# Patient Record
Sex: Male | Born: 1950 | Race: White | Hispanic: No | Marital: Married | Smoking: Current some day smoker
Health system: Southern US, Community
[De-identification: ages and names within clinical notes are randomized; demographics above are authoritative.]

## PROBLEM LIST (undated history)

## (undated) DIAGNOSIS — A809 Acute poliomyelitis, unspecified: Secondary | ICD-10-CM

## (undated) DIAGNOSIS — R17 Unspecified jaundice: Secondary | ICD-10-CM

## (undated) DIAGNOSIS — Z8619 Personal history of other infectious and parasitic diseases: Secondary | ICD-10-CM

## (undated) DIAGNOSIS — I4891 Unspecified atrial fibrillation: Secondary | ICD-10-CM

## (undated) DIAGNOSIS — E119 Type 2 diabetes mellitus without complications: Secondary | ICD-10-CM

## (undated) DIAGNOSIS — I82409 Acute embolism and thrombosis of unspecified deep veins of unspecified lower extremity: Secondary | ICD-10-CM

## (undated) DIAGNOSIS — M1710 Unilateral primary osteoarthritis, unspecified knee: Secondary | ICD-10-CM

## (undated) HISTORY — PX: LEG SURGERY: SHX1003

## (undated) HISTORY — DX: Personal history of other infectious and parasitic diseases: Z86.19

## (undated) HISTORY — DX: Unilateral primary osteoarthritis, unspecified knee: M17.10

## (undated) HISTORY — DX: Unspecified jaundice: R17

## (undated) HISTORY — PX: KNEE ARTHROSCOPY: SUR90

## (undated) HISTORY — DX: Acute poliomyelitis, unspecified: A80.9

## (undated) HISTORY — PX: CATARACT EXTRACTION: SUR2

## (undated) HISTORY — PX: RETINAL DETACHMENT SURGERY: SHX105

## (undated) HISTORY — DX: Acute embolism and thrombosis of unspecified deep veins of unspecified lower extremity: I82.409

## (undated) HISTORY — PX: TONSILLECTOMY: SUR1361

---

## 1999-12-28 ENCOUNTER — Encounter: Payer: Self-pay | Admitting: Ophthalmology

## 1999-12-28 ENCOUNTER — Ambulatory Visit (HOSPITAL_COMMUNITY): Admission: RE | Admit: 1999-12-28 | Discharge: 1999-12-29 | Payer: Self-pay | Admitting: Ophthalmology

## 2009-05-16 LAB — HM COLONOSCOPY

## 2010-01-20 ENCOUNTER — Ambulatory Visit: Payer: Self-pay | Admitting: Family Medicine

## 2010-01-20 DIAGNOSIS — R209 Unspecified disturbances of skin sensation: Secondary | ICD-10-CM

## 2010-01-29 ENCOUNTER — Encounter: Payer: Self-pay | Admitting: Family Medicine

## 2010-03-01 ENCOUNTER — Ambulatory Visit (HOSPITAL_BASED_OUTPATIENT_CLINIC_OR_DEPARTMENT_OTHER): Admission: RE | Admit: 2010-03-01 | Discharge: 2010-03-01 | Payer: Self-pay | Admitting: Family Medicine

## 2010-03-01 ENCOUNTER — Ambulatory Visit: Payer: Self-pay | Admitting: Diagnostic Radiology

## 2010-03-01 ENCOUNTER — Ambulatory Visit: Payer: Self-pay | Admitting: Family Medicine

## 2010-03-01 DIAGNOSIS — M20019 Mallet finger of unspecified finger(s): Secondary | ICD-10-CM

## 2010-03-17 ENCOUNTER — Ambulatory Visit: Payer: Self-pay | Admitting: Family Medicine

## 2010-03-17 DIAGNOSIS — M25579 Pain in unspecified ankle and joints of unspecified foot: Secondary | ICD-10-CM

## 2010-04-12 ENCOUNTER — Telehealth: Payer: Self-pay | Admitting: Family Medicine

## 2010-04-12 ENCOUNTER — Ambulatory Visit: Payer: Self-pay | Admitting: Family Medicine

## 2010-04-13 ENCOUNTER — Encounter: Payer: Self-pay | Admitting: Family Medicine

## 2010-06-15 NOTE — Assessment & Plan Note (Signed)
Summary: new to est/kn   Vital Signs:  Patient profile:   60 year old male Height:      66.50 inches Weight:      182 pounds BMI:     29.04 Pulse rate:   62 / minute BP sitting:   122 / 80  (left arm)  Vitals Entered By: Doristine Devoid CMA (January 20, 2010 10:02 AM) CC: NEW EST- R foot numbness and tingling along w/ intermittent pain   History of Present Illness: 60 yo man here today to establish care.  previous PCP- Robyn Zanard.  due for CPE.  had colonoscopy last year.  1) R foot numbness- sxs started 'several months ago'.  initially toes were tingling.  this has worsened and the numbness has progressed down his foot to the ball of the foot.  denies pain, more of a 'pins and needles' feeling.  when wearing shoes pt feels socks are 'bunched up around my toes'.  all toes are affected but lateral more than medial.  no back pain.  will also have some tingling in the calf.  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        Drug Use:  never.    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  Past Surgical History: Detached Retina-L eye Arthroscopic Knee- x2 R knee Several surgeries on L leg 2' polio Tonsillectomy Cataract extraction- L eye  Family History: CAD-no HTN-mother DM-brother STROKE-no COLON CA-no PROSTATE CA-no  Social History: married, daughter [place] works as a Information systems manager, previously a Tour manager Smoking Status:  never Sexual History:  currently monogamous Drug Use:  never  Review of Systems      See HPI  Physical Exam  General:   Well-developed,well-nourished,in no acute distress; alert,appropriate and cooperative throughout examination Head:  NCAT Msk:  atrophied L leg Pulses:  +2 DP/PT Extremities:  no C/C/E Neurologic:  strength normal on R- normal dorsi/plantar flexion, inversion/eversion of R ankle sensation intact to light touch gait normal DTRs symmetrical and normal.     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  needs nerve conduction study to determine where nerve is being compressed- proximal tib/fib jxn, tibialis anterior, tarsal tunnel.  start neurontin.  reviewed supportive care and red flags that should prompt return.  Pt expresses understanding and is in agreement w/ this plan. Orders: Orthopedic Referral (Ortho)  Complete Medication List: 1)  Neurontin 300 Mg Caps (Gabapentin) .Marland Kitchen.. 1 tab by mouth at bedtime x1 week and then increase to two times a day x1 week and then three times a day.  Patient Instructions: 1)  Please schedule your complete physical at your convenience- do not eat before this appt 2)  Start the neurontin as directed- this may cause drowsiness 3)  Someone will call you with your nerve conduction study 4)  Call with any questions or concerns 5)  Welcome!  We're glad to have you! Prescriptions: NEURONTIN 300  MG CAPS (GABAPENTIN) 1 tab by mouth at bedtime x1 week and then increase to two times a day x1 week and then three times a day.  #90 x 3   Entered and Authorized by:   Neena Rhymes MD   Signed by:   Neena Rhymes MD on 01/20/2010   Method used:   Electronically to        [facility]  [address] [phone]* (retail)       [address]       [place], [place]  [address]       Ph: [phone] or [phone]       Fax: [phone]   RxID:   [id number]

## 2010-06-15 NOTE — Assessment & Plan Note (Signed)
Summary: leg is swollen at the bottom/kn   Vital Signs:  Patient profile:   60 year old male Weight:      181 pounds Temp:     98.3 degrees F oral BP sitting:   128 / 76  (left arm)  Vitals Entered By: Doristine Devoid CMA (March 17, 2010 1:05 PM) CC: R ankle swollen and painful x1 wk    History of Present Illness: 60 yo man here today for R ankle pain and swelling.  pain initially started on R inner thigh at the end of Sept.  pain moved down to medial knee after riding bike to beach.  this resolved on its own.  pain then migrated to calf.  again resolved.  now pain is in the ankle.  sxs started here 7-10 days ago.  having difficulty walking.  icing regularly.  has used sporadic ibuprofen.  Current Medications (verified): 1)  Neurontin 300 Mg Caps (Gabapentin) .Marland Kitchen.. 1 Tab By Mouth At Bedtime X1 Week and Then Increase To Two Times A Day X1 Week and Then Three Times A Day.  Allergies (verified): No Known Drug Allergies  Review of Systems      See HPI  Physical Exam  General:  Well-developed,well-nourished,in no acute distress; alert,appropriate and cooperative throughout examination Msk:  + swelling and TTP over tibialis anterior tendon.  no pain w/ dorsi or plantar flexion, no pain w/ inversion, pain w/ resisted eversion.  no bony tenderness Pulses:  +2 DP/PT   Impression & Recommendations:  Problem # 1:  ANKLE PAIN (ICD-719.47) Assessment New pt w/ apparent tendonitis/bursitis of R ankle.  start NSAIDs.  given migratory nature of pain and fact that pt is overly R leg dominant due to residual effects of polio on the L suspect that pt has been compensating for areas that hurt- causing the pain to move.  given this issue w/ the kinetic chain and his reliance on his R leg will refer to sports med for evaluation and possible tx.  pt in agreement. Orders: Sports Medicine (Sports Med)  Complete Medication List: 1)  Neurontin 300 Mg Caps (Gabapentin) .Marland Kitchen.. 1 tab by mouth at bedtime x1  week and then increase to two times a day x1 week and then three times a day. 2)  Naproxen 500 Mg Tabs (Naproxen) .Marland Kitchen.. 1 tab by mouth two times a day x7-10 days and then as needed for pain.  take w/ food.  Patient Instructions: 1)  This appears to be tendonitis/bursitis 2)  The Naproxen should help w/ the pain and inflammation 3)  Continue to ice your ankle and elevate it as much as possible 4)  We will call you with the appt for Sports Medicine 5)  Hang in there!! Prescriptions: NAPROXEN 500 MG TABS (NAPROXEN) 1 tab by mouth two times a day x7-10 days and then as needed for pain.  take w/ food.  #60 x 1   Entered and Authorized by:   Neena Rhymes MD   Signed by:   Neena Rhymes MD on 03/17/2010   Method used:   [facility] to        [facility]  [address] [phone]* (retail)       [address]       [place], [place]  [address]       Ph: [phone] or [phone]       Fax: [phone]   RxID:   [id number]    Orders  Added: 1)  Sports Medicine [Sports Med] 2)  Est. Patient Level III [[address]109]

## 2010-06-15 NOTE — Assessment & Plan Note (Signed)
Summary: L pinky jammed /KN   Vital Signs:  Patient profile:   60 year old male Weight:      177 pounds Pulse rate:   110 / minute BP sitting:   130 / 80  (left arm)  Vitals Entered By: Doristine Devoid CMA (March 01, 2010 2:01 PM) CC: L pinky bent after jamming it can't straighten out unless it's laying flat   History of Present Illness: 60 yo man here today for jammed L pinky.  occurred 2 weeks, hasn't been able to straighten it.  'it doesn't hurt, it's just a little uncomfortable'.  no swelling, some bruising.  jammed finger while playing w/ the dog.  Current Medications (verified): 1)  Neurontin 300 Mg Caps (Gabapentin) .Marland Kitchen.. 1 Tab By Mouth At Bedtime X1 Week and Then Increase To Two Times A Day X1 Week and Then Three Times A Day.  Allergies (verified): No Known Drug Allergies  Review of Systems      See HPI  Physical Exam  General:  Well-developed,well-nourished,in no acute distress; alert,appropriate and cooperative throughout examination Msk:  L 5th finger w/ flexion at DIP joint w/ inability to actively extend Pulses:  +2 radial, ulnar   Impression & Recommendations:  Problem # 1:  MALLET FINGER (ICD-736.1) Assessment New pt w/ textbook mallet finger.  check xray to ensure no fracture or dislocation.  stressed importance of immobilization in complete extension for 6-8 weeks to allow healing.  pt and wife expressed understanding.  finger splinted in office. Orders: T-Finger(s) (73140TC)  Complete Medication List: 1)  Neurontin 300 Mg Caps (Gabapentin) .Marland Kitchen.. 1 tab by mouth at bedtime x1 week and then increase to two times a day x1 week and then three times a day.  Patient Instructions: 1)  This is a Mallet Finger 2)  Schedule a follow up with me in 6 weeks- sooner if needed 3)  You must keep the finger immobilized in full extension 4)  If there is a break, I'll refer you to ortho 5)  Tylenol, ibuprofen, ice as needed for pain relief 6)  Hang in there!   Orders  Added: 1)  T-Finger(s) [73140TC] 2)  Est. Patient Level III [44010]

## 2010-06-15 NOTE — Progress Notes (Signed)
Summary: ANKLE PAIN REFERRAL  Phone Note Other Incoming   Summary of Call: IN REFERENCE TO ANKLE PAIN REFERRAL TO Dr Pearletha Forge, PATIENT NOSHOWED FOR HIS APPOINTMENT ON 03-19-2010, I LEFT MESSAGE, & SPOKE WITH PATIENT, SO HE WAS AWARE OF THE APPT, HE DID NOT RSC'D. Initial call taken by: Magdalen Spatz [facility],  April 12, 2010 4:45 PM  Follow-up for Phone Call        noted Follow-up by: Neena Rhymes MD,  April 12, 2010 4:47 PM

## 2010-06-15 NOTE — Assessment & Plan Note (Signed)
Summary: RECHECK LEFT PINKY FINGER///SPH   Vital Signs:  Patient profile:   60 year old male Height:      66.50 inches Weight:      181 pounds BMI:     28.88 Temp:     98.2 degrees F oral Pulse rate:   76 / minute BP sitting:   122 / 82  (left arm)  Vitals Entered By: Jeremy Johann CMA (April 12, 2010 3:56 PM) CC: recheck finger   History of Present Illness: 60 yo man here today for f/u on his mallet finger.  has been immobilized in 'full extension' for 6 weeks.  pt removed splint every 3-4 days to wash hand and change the tape.  took splint off this AM.  finger is irritated due to tape and pt is unhappy w/ the result.  reports DIP joint is now bent and 'crooked', painful to straighten.  Current Medications (verified): 1)  Neurontin 300 Mg Caps (Gabapentin) .Marland Kitchen.. 1 Tab By Mouth At Bedtime X1 Week and Then Increase To Two Times A Day X1 Week and Then Three Times A Day. 2)  Naproxen 500 Mg Tabs (Naproxen) .Marland Kitchen.. 1 Tab By Mouth Two Times A Day X7-10 Days and Then As Needed For Pain.  Take W/ Food.  Allergies (verified): No Known Drug Allergies  Review of Systems      See HPI  Physical Exam  General:  Well-developed,well-nourished,in no acute distress; alert,appropriate and cooperative throughout examination Msk:  L little finger remains bent at DIP joint w/ inability to extend.  DIP joint is also now angled toward the 4th finger. Pulses:  +2 radial, ulnar.   Impression & Recommendations:  Problem # 1:  MALLET FINGER (ICD-736.1) Assessment Unchanged pt kept finger splinted but apparently not in complete extension.  it appears that as he removed the splint to wash and change tape he did not extend/hyperextend his finger before resplinting.  he never returned to the office to check position.  given that finger remains flexed and is now angulated, will refer to hand specialist. Orders: Orthopedic Referral (Ortho)  Complete Medication List: 1)  Neurontin 300 Mg Caps  (Gabapentin) .Marland Kitchen.. 1 tab by mouth at bedtime x1 week and then increase to two times a day x1 week and then three times a day. 2)  Naproxen 500 Mg Tabs (Naproxen) .Marland Kitchen.. 1 tab by mouth two times a day x7-10 days and then as needed for pain.  take w/ food.  Patient Instructions: 1)  Keep finger splinted as straight as possible until your appt w/ the hand specialist 2)  Use paper tape 3)  Ibuprofen as needed for pain 4)  Hang in there!   Orders Added: 1)  Orthopedic Referral [Ortho] 2)  Est. Patient Level III [16109]

## 2010-06-17 NOTE — Consult Note (Signed)
Summary: [facility] [place]   Imported By: Lanelle Bal 04/27/2010 08:55:30  _____________________________________________________________________  External Attachment:    Type:   Image     Comment:   External Document

## 2010-10-01 NOTE — Op Note (Signed)
[place]  Patient:    Connor Griffith, Connor Griffith                     MRN: [id number] Proc. Date: 12/28/99 Adm. Date:  [id number] Attending:  Bertrum Sol                           Operative Report  DATE OF BIRTH: 05/04/1951  ADMISSION DIAGNOSIS: Retinal breaks right eye, retinal detachment left eye.  PROCEDURES: Retinal photocoagulation around breaks right eye and scleral buckle left eye, retinal photocoagulation left eye.  SURGEON: Alan Mulder, M.D.  ASSISTANT: Winfred Burn, COA,SA  ANESTHESIA: General.  DETAILS: After proper endotracheal anesthesia attention was carried to the right eye where multiple breaks were seen superiorly, inferiorly and temporally. These were surrounded with the indirect ophthalmoscope laser, 483 burns were placed around breaks with the power between 360 and 400 milliwatts, 1000 microns each and duration between 0.05 and 0.10 seconds.  The eye was then treated with ointment and closed.  Attention was carried to the left eye where the usual prep and drape was performed.  A 360 degree limbal peritomy isolation of four rectus muscles on 2-0 Silk. Localization of break at 2:00 with one cryo-application.  Scleral dissection from 12:00 to 6:00 to admit a #279 intrascleral implants and 1 mm was trimmed from the posterior edge at 5:00 where a vortex vein was encountered. Diathermy was placed in the bed. Two scleral sutures were placed in the upper quadrant and two in the lower quadrants. Knots were placed posteriorly where they would have been visualized. A perforation site at 4:00 revealed moderate amount of thick colorless subretinal fluids. A perforation at 3:00 in the posterior aspect of the bed resulted in a large amount of clear colorless subretinal fluid. A 50AG was fashioned in a segmental fashion and placed at 2:00. A #279 implant was trimmed and placed in the bed from 12 to 6:00. A 240 band was placed  around the eye with a belt loop at 8:00 and 10:00. A 270 sleeve was placed at 11:00. The band was adjusted to a proper indentation of the globe. The ends were trimmed. The indirect ophthalmoscopy showed the retina to be lying nicely on the scleral buckle. The indirect ophthalmoscope placed and was moved into place and 638 burns were placed on the buckle with a power between 360 and 400 milliwatts, 1000 microns each and 0.05 to 0.10 duration. The buckle ends were trimmed and the suture ends were trimmed. The conjunctiva was reposited of 7-0 chromic suture. Polymyxin and gentamicin were irrigated in the tenon space. Atropine solution was applied. Decadron 10 mg was injected into the lower subconjunctival space. Marcaine was injected around the globe for postop pain. The closing tension was 10 with a Barraquer tonometer.  COMPLICATIONS: None.  DURATION: 2 hours.  Polysporin, a patch and shield were placed on the left eye. The patient was awakened and taken to recovery in satisfactory condition. DD:  12/28/99 TD:  12/28/99 Job: [id number]

## 2010-12-03 ENCOUNTER — Encounter: Payer: Self-pay | Admitting: Family Medicine

## 2011-04-13 ENCOUNTER — Encounter: Payer: Self-pay | Admitting: Family Medicine

## 2011-04-13 ENCOUNTER — Ambulatory Visit (INDEPENDENT_AMBULATORY_CARE_PROVIDER_SITE_OTHER): Payer: BC Managed Care – PPO | Admitting: Family Medicine

## 2011-04-13 DIAGNOSIS — Z0000 Encounter for general adult medical examination without abnormal findings: Secondary | ICD-10-CM | POA: Insufficient documentation

## 2011-04-13 DIAGNOSIS — M25579 Pain in unspecified ankle and joints of unspecified foot: Secondary | ICD-10-CM

## 2011-04-13 NOTE — Progress Notes (Signed)
  Subjective:    Patient ID: Connor Griffith, male    DOB: 1950-08-19, 60 y.o.   MRN: [id number]  HPI CPE- UTD on colonoscopy, [place] (2010).  R foot numbness- sxs are localized to toes, had nerve conduction study done last year.  Now having ankle swelling and pain- TTP over R medial malleolus.   Review of Systems Patient reports no vision/hearing changes, anorexia, fever ,adenopathy, persistant/recurrent hoarseness, swallowing issues, chest pain, palpitations, edema, persistant/recurrent cough, hemoptysis, dyspnea (rest,exertional, paroxysmal nocturnal), gastrointestinal  bleeding (melena, rectal bleeding), abdominal pain, excessive heart burn, GU symptoms (dysuria, hematuria, voiding/incontinence issues) syncope, focal weakness, memory loss, skin/hair/nail changes, depression, anxiety, abnormal bruising/bleeding.    Objective:   Physical Exam BP 120/80  Pulse 78  Temp(Src) 98.8 F (37.1 C) (Oral)  Ht 5\' 6"  (1.676 m)  Wt 181 lb 6.4 oz (82.283 kg)  BMI 29.28 kg/m2  SpO2 97%  General Appearance:    Alert, cooperative, no distress, appears stated age  Head:    Normocephalic, without obvious abnormality, atraumatic  Eyes:    PERRL, conjunctiva/corneas clear, EOM's intact      Ears:    Normal TM's and external ear canals, both ears  Nose:   Nares normal, septum midline, mucosa normal, no drainage   or sinus tenderness  Throat:   Lips, mucosa, and tongue normal; teeth and gums normal  Neck:   Supple, symmetrical, trachea midline, no adenopathy;       thyroid:  No enlargement/tenderness/nodules  Back:     Symmetric, no curvature, ROM normal, no CVA tenderness  Lungs:     Clear to auscultation bilaterally, respirations unlabored  Chest wall:    No tenderness or deformity  Heart:    Regular rate and rhythm, S1 and S2 normal, no murmur, rub   or gallop  Abdomen:     Soft, non-tender, bowel sounds active all four quadrants,    no masses, no organomegaly  Genitalia:    Normal male  without lesion, discharge or tenderness  Rectal:    Normal tone, normal prostate, no masses or tenderness  Extremities:   Mild swelling posterior to R medial malleolus, + TTP  Pulses:   2+ and symmetric all extremities  Skin:   Skin color, texture, turgor normal, no rashes or lesions  Lymph nodes:   Cervical, supraclavicular, and axillary nodes normal  Neurologic:   CNII-XII intact. Normal strength, sensation and reflexes      throughout          Assessment & Plan:

## 2011-04-13 NOTE — Assessment & Plan Note (Signed)
Pt's PE WNL.  Check labs.  UTD on health maintenance.  Anticipatory guidance provided.

## 2011-04-13 NOTE — Assessment & Plan Note (Signed)
R ankle pain is concerning to pt given L foot deformity from early polio infxn.  Will refer to ortho for complete evaluation and tx.  Pt expressed understanding and is in agreement w/ plan.

## 2011-04-13 NOTE — Patient Instructions (Signed)
We'll notify you of your lab results You look great!  Keep up the good work! We'll call you with your ortho appt for the ankle Call with any questions or concerns Happy Holidays!!!

## 2011-04-14 LAB — BASIC METABOLIC PANEL
BUN: 20 mg/dL (ref 6–23)
CO2: 27 mEq/L (ref 19–32)
Calcium: 8.9 mg/dL (ref 8.4–10.5)
Chloride: 106 mEq/L (ref 96–112)
Creatinine, Ser: 1.2 mg/dL (ref 0.4–1.5)
GFR: 68.94 mL/min (ref >60.00)
Glucose, Bld: 110 mg/dL — ABNORMAL HIGH (ref 70–99)
Potassium: 4.1 mEq/L (ref 3.5–5.1)
Sodium: 139 mEq/L (ref 135–145)

## 2011-04-14 LAB — HEPATIC FUNCTION PANEL
ALT: 30 U/L (ref 0–53)
AST: 24 U/L (ref 0–37)
Albumin: 3.9 g/dL (ref 3.5–5.2)
Alkaline Phosphatase: 37 U/L — ABNORMAL LOW (ref 39–117)
Bilirubin, Direct: 0.1 mg/dL (ref 0.0–0.3)
Total Bilirubin: 0.5 mg/dL (ref 0.3–1.2)
Total Protein: 6.7 g/dL (ref 6.0–8.3)

## 2011-04-14 LAB — CBC WITH DIFFERENTIAL/PLATELET
Basophils Absolute: 0 10*3/uL (ref 0.0–0.1)
Basophils Relative: 0.2 % (ref 0.0–3.0)
Eosinophils Absolute: 0.2 10*3/uL (ref 0.0–0.7)
Eosinophils Relative: 3.5 % (ref 0.0–5.0)
HCT: 43.1 % (ref 39.0–52.0)
Hemoglobin: 14.9 g/dL (ref 13.0–17.0)
Lymphocytes Relative: 53.6 % — ABNORMAL HIGH (ref 12.0–46.0)
Lymphs Abs: 2.9 10*3/uL (ref 0.7–4.0)
MCHC: 34.6 g/dL (ref 30.0–36.0)
MCV: 90.7 fl (ref 78.0–100.0)
Monocytes Absolute: 0.5 10*3/uL (ref 0.1–1.0)
Monocytes Relative: 8.6 % (ref 3.0–12.0)
Neutro Abs: 1.8 10*3/uL (ref 1.4–7.7)
Neutrophils Relative %: 34.1 % — ABNORMAL LOW (ref 43.0–77.0)
Platelets: 183 10*3/uL (ref 150.0–400.0)
RBC: 4.75 Mil/uL (ref 4.22–5.81)
RDW: 12.8 % (ref 11.5–14.6)
WBC: 5.4 10*3/uL (ref 4.5–10.5)

## 2011-04-14 LAB — TSH: TSH: 3.47 u[IU]/mL (ref 0.35–5.50)

## 2011-04-15 LAB — NMR LIPOPROFILE WITH LIPIDS
Cholesterol, Total: 182 mg/dL (ref <200)
HDL Particle Number: 37 umol/L (ref >=30.5)
LDL (calc): 87 mg/dL (ref <100)
LP-IR Score: 81 — ABNORMAL HIGH (ref <=45)
Triglycerides: 245 mg/dL — ABNORMAL HIGH (ref <150)

## 2011-04-18 MED ORDER — ATORVASTATIN CALCIUM 20 MG PO TABS: 20.0000 mg | ORAL_TABLET | Freq: Every day | ORAL | Status: DC

## 2011-04-18 NOTE — Progress Notes (Signed)
Addended by: Derry Lory A on: 04/18/2011 03:48 PM   Modules accepted: Orders

## 2011-04-28 ENCOUNTER — Ambulatory Visit (INDEPENDENT_AMBULATORY_CARE_PROVIDER_SITE_OTHER): Payer: BC Managed Care – PPO | Admitting: Internal Medicine

## 2011-04-28 ENCOUNTER — Encounter: Payer: Self-pay | Admitting: Internal Medicine

## 2011-04-28 VITALS — BP 122/86 | HR 70 | Temp 98.1°F | Wt 176.4 lb

## 2011-04-28 DIAGNOSIS — E785 Hyperlipidemia, unspecified: Secondary | ICD-10-CM

## 2011-04-28 DIAGNOSIS — J019 Acute sinusitis, unspecified: Secondary | ICD-10-CM

## 2011-04-28 MED ORDER — HYDROCODONE-HOMATROPINE 5-1.5 MG/5ML PO SYRP: 5.0000 mL | ORAL_SOLUTION | Freq: Four times a day (QID) | ORAL | Status: AC | PRN

## 2011-04-28 MED ORDER — AMOXICILLIN 500 MG PO CAPS: 500.0000 mg | ORAL_CAPSULE | Freq: Three times a day (TID) | ORAL | Status: AC

## 2011-04-28 MED ORDER — FLUTICASONE PROPIONATE 50 MCG/ACT NA SUSP: NASAL | Status: AC

## 2011-04-28 NOTE — Progress Notes (Signed)
  Subjective:    Patient ID: Connor Griffith, male    DOB: [birth year], 60 y.o.   MRN: [id number]  HPI Respiratory tract infection Onset/symptoms:12/4 as head cold with dry cough, itchy eyes, & sneezing Exposures (illness/environmental/extrinsic):no Progression of symptoms:to low grade fever 12/8-9 Treatments/response:Cold Ease w/o benefit; Fusion Cold & Flu +/- response Present symptoms: Fever/chills/sweats:no Frontal headache:no Facial pain:no Nasal purulence:yes Sore throat:off & on, not now Dental pain:no Lymphadenopathy:no Wheezing/shortness of breath:wheeze after paroxysmal coughing Cough/sputum/hemoptysis:no Pleuritic pain:no Past medical history: Seasonal allergies; no/asthma:no Smoking history:never           Review of Systems     Objective:   Physical Exam General appearance is of good health and nourishment; no acute distress or increased work of breathing is present.  No  lymphadenopathy about the head, neck, or axilla noted.   Eyes: No conjunctival inflammation or lid edema is present.  Ears:  External ear exam shows no significant lesions or deformities.  Otoscopic examination reveals clear canals, tympanic membranes are intact bilaterally without bulging, retraction, inflammation or discharge.  Nose:  External nasal examination shows no deformity or inflammation. Nasal mucosa are pink and moist without lesions or exudates. No visible  septal dislocation , slight deviation to R.No obstruction to airflow.   Oral exam: Dental hygiene is good; lips and gums are healthy appearing.There is no significant  oropharyngeal erythema or exudate noted.     Heart:  Normal rate and regular rhythm. S1 and S2 normal without gallop, murmur, click, rub .S 4 Lungs:Chest clear to auscultation; no wheezes, rhonchi,rales ,or rubs present.No increased work of breathing.    Extremities:  No cyanosis, edema, or clubbing  noted    Skin: Warm & dry           Assessment &  Plan:  #1 upper respiratory tract infection; low-grade rhinosinusitis is suggested by persistent congestion and nasal purulence.  Plan: See orders and recommendations.

## 2011-04-28 NOTE — Patient Instructions (Signed)
Plain Mucinex for thick secretions ;force NON dairy fluids . Use a Neti pot daily as needed for sinus congestion . Fluticasone 1 spray in each nostril twice a day as needed. Use the "crossover" technique as discussed   

## 2013-03-14 ENCOUNTER — Encounter: Payer: Self-pay | Admitting: Family Medicine

## 2013-03-14 ENCOUNTER — Ambulatory Visit (INDEPENDENT_AMBULATORY_CARE_PROVIDER_SITE_OTHER): Payer: BC Managed Care – PPO | Admitting: Family Medicine

## 2013-03-14 VITALS — BP 126/78 | HR 78 | Temp 98.2°F | Resp 16 | Wt 187.5 lb

## 2013-03-14 DIAGNOSIS — I82A19 Acute embolism and thrombosis of unspecified axillary vein: Secondary | ICD-10-CM

## 2013-03-14 DIAGNOSIS — Z86718 Personal history of other venous thrombosis and embolism: Secondary | ICD-10-CM | POA: Insufficient documentation

## 2013-03-14 DIAGNOSIS — I82A12 Acute embolism and thrombosis of left axillary vein: Secondary | ICD-10-CM

## 2013-03-14 MED ORDER — RIVAROXABAN 20 MG PO TABS: 20.0000 mg | ORAL_TABLET | Freq: Every day | ORAL | Status: DC

## 2013-03-14 NOTE — Patient Instructions (Signed)
Schedule your complete physical at your convenience Once you complete the 21 day supply of the twice daily medicine, switch to the 20mg  once daily on day 22 Call if your leg again swells or becomes painful Call with any questions or concerns Happy Anniversary and have a great trip!!!!

## 2013-03-14 NOTE — Progress Notes (Signed)
  Subjective:    Patient ID: Connor Griffith, male    DOB: 09-27-1950, 62 y.o.   MRN: [id number]  HPI Hospital f/u- rode bike to beach on 10/3-4.  Next week had pain and swelling of L leg w/ associated redness.  Pain worsened- particularly w/ weight bearing.  Hx of polio in that leg.  Was visiting wife in hospital and she kept telling him 'it looked like a blood clot'.  Wife's RN took pt to ER for evaluation at [facility].  Had DVT on Doppler.  Got 1 shot of Lovenox and then started xarelto the next day.  Started xarelto on 10/15.  Has 21 day supply of medicine.   Review of Systems For ROS see HPI     Objective:   Physical Exam  Vitals reviewed. Constitutional: He appears well-developed and well-nourished. No distress.  Cardiovascular: Normal rate, regular rhythm, normal heart sounds and intact distal pulses.   Pulmonary/Chest: Effort normal and breath sounds normal. No respiratory distress. He has no wheezes. He has no rales.  Musculoskeletal: He exhibits edema (trace-1+ edema of L lower leg). He exhibits no tenderness.  Skin: Skin is warm and dry. No erythema.          Assessment & Plan:

## 2013-03-16 NOTE — Assessment & Plan Note (Signed)
New.  No obvious cause.  Pt has 21 day supply of Xarelto, will switch to 20mg  daily dose on day 22.  Will anticoagulate x6 months.  Will hold on coagulopathy work up as this is pt's 1st clot.  Will follow.  Pt expressed understanding and is in agreement w/ plan.

## 2013-05-17 ENCOUNTER — Telehealth: Payer: Self-pay

## 2013-05-17 NOTE — Telephone Encounter (Signed)
Patient did not have time to talk.  Confirmed he will be at appt

## 2013-05-20 ENCOUNTER — Ambulatory Visit (INDEPENDENT_AMBULATORY_CARE_PROVIDER_SITE_OTHER): Payer: BC Managed Care – PPO | Admitting: Family Medicine

## 2013-05-20 ENCOUNTER — Encounter: Payer: Self-pay | Admitting: Family Medicine

## 2013-05-20 VITALS — BP 140/84 | HR 89 | Temp 98.2°F | Ht 67.0 in | Wt 193.4 lb

## 2013-05-20 DIAGNOSIS — Z0000 Encounter for general adult medical examination without abnormal findings: Secondary | ICD-10-CM | POA: Insufficient documentation

## 2013-05-20 DIAGNOSIS — Z23 Encounter for immunization: Secondary | ICD-10-CM

## 2013-05-20 NOTE — Patient Instructions (Signed)
Follow up in 1 year or as needed We'll notify you of your lab results and make any changes if needed Keep up the good work!  You look great!!! Happy New Year!!!

## 2013-05-20 NOTE — Progress Notes (Signed)
   Subjective:    Patient ID: Connor Griffith, male    DOB: 11/23/1950, 63 y.o.   MRN: [id number]  HPI CPE- UTD on colonoscopy (2011).  No concerns.   Review of Systems Patient reports no vision/hearing changes, anorexia, fever ,adenopathy, persistant/recurrent hoarseness, swallowing issues, chest pain, palpitations, edema, persistant/recurrent cough, hemoptysis, dyspnea (rest,exertional, paroxysmal nocturnal), gastrointestinal  bleeding (melena, rectal bleeding), abdominal pain, excessive heart burn, GU symptoms (dysuria, hematuria, voiding/incontinence issues) syncope, focal weakness, memory loss, numbness & tingling, skin/hair/nail changes, depression, anxiety, abnormal bruising/bleeding, musculoskeletal symptoms/signs.     Objective:   Physical Exam BP 140/84  Pulse 89  Temp(Src) 98.2 F (36.8 C) (Oral)  Ht 5\' 7"  (1.702 m)  Wt 193 lb 6.4 oz (87.726 kg)  BMI 30.28 kg/m2  SpO2 97%  General Appearance:    Alert, cooperative, no distress, appears stated age  Head:    Normocephalic, without obvious abnormality, atraumatic  Eyes:    PERRL, conjunctiva/corneas clear, EOM's intact, fundi    benign, both eyes       Ears:    Normal TM's and external ear canals, both ears  Nose:   Nares normal, septum midline, mucosa normal, no drainage   or sinus tenderness  Throat:   Lips, mucosa, and tongue normal; teeth and gums normal  Neck:   Supple, symmetrical, trachea midline, no adenopathy;       thyroid:  No enlargement/tenderness/nodules  Back:     Symmetric, no curvature, ROM normal, no CVA tenderness  Lungs:     Clear to auscultation bilaterally, respirations unlabored  Chest wall:    No tenderness or deformity  Heart:    Regular rate and rhythm, S1 and S2 normal, no murmur, rub   or gallop  Abdomen:     Soft, non-tender, bowel sounds active all four quadrants,    no masses, no organomegaly  Genitalia:    Normal male without lesion, discharge or tenderness  Rectal:    Normal tone,  normal prostate, no masses or tenderness  Extremities:   Extremities normal, atraumatic, no cyanosis or edema  Pulses:   2+ and symmetric all extremities  Skin:   Skin color, texture, turgor normal, no rashes or lesions  Lymph nodes:   Cervical, supraclavicular, and axillary nodes normal  Neurologic:   CNII-XII intact. Normal strength, sensation and reflexes      throughout          Assessment & Plan:

## 2013-05-21 LAB — CBC WITH DIFFERENTIAL/PLATELET
BASOS ABS: 0 10*3/uL (ref 0.0–0.1)
Basophils Relative: 0.6 % (ref 0.0–3.0)
Eosinophils Absolute: 0.1 10*3/uL (ref 0.0–0.7)
Eosinophils Relative: 2 % (ref 0.0–5.0)
HEMATOCRIT: 41.8 % (ref 39.0–52.0)
Hemoglobin: 14.5 g/dL (ref 13.0–17.0)
Lymphocytes Relative: 26.1 % (ref 12.0–46.0)
Lymphs Abs: 1.7 10*3/uL (ref 0.7–4.0)
MCHC: 34.6 g/dL (ref 30.0–36.0)
MCV: 89.5 fl (ref 78.0–100.0)
MONO ABS: 0.3 10*3/uL (ref 0.1–1.0)
MONOS PCT: 5 % (ref 3.0–12.0)
NEUTROS ABS: 4.4 10*3/uL (ref 1.4–7.7)
Neutrophils Relative %: 66.3 % (ref 43.0–77.0)
PLATELETS: 224 10*3/uL (ref 150.0–400.0)
RBC: 4.67 Mil/uL (ref 4.22–5.81)
RDW: 13.4 % (ref 11.5–14.6)
WBC: 6.6 10*3/uL (ref 4.5–10.5)

## 2013-05-21 LAB — HEPATIC FUNCTION PANEL
ALBUMIN: 3.9 g/dL (ref 3.5–5.2)
ALT: 29 U/L (ref 0–53)
AST: 30 U/L (ref 0–37)
Alkaline Phosphatase: 34 U/L — ABNORMAL LOW (ref 39–117)
Bilirubin, Direct: 0.2 mg/dL (ref 0.0–0.3)
TOTAL PROTEIN: 6.4 g/dL (ref 6.0–8.3)
Total Bilirubin: 0.7 mg/dL (ref 0.3–1.2)

## 2013-05-21 LAB — LIPID PANEL
CHOLESTEROL: 180 mg/dL (ref 0–200)
HDL: 40.1 mg/dL (ref >39.00)
LDL Cholesterol: 105 mg/dL — ABNORMAL HIGH (ref 0–99)
TRIGLYCERIDES: 173 mg/dL — AB (ref 0.0–149.0)
Total CHOL/HDL Ratio: 4
VLDL: 34.6 mg/dL (ref 0.0–40.0)

## 2013-05-21 LAB — BASIC METABOLIC PANEL
BUN: 15 mg/dL (ref 6–23)
CO2: 30 meq/L (ref 19–32)
CREATININE: 1.3 mg/dL (ref 0.4–1.5)
Calcium: 8.8 mg/dL (ref 8.4–10.5)
Chloride: 102 mEq/L (ref 96–112)
GFR: 61.61 mL/min (ref >60.00)
Glucose, Bld: 159 mg/dL — ABNORMAL HIGH (ref 70–99)
Potassium: 3.8 mEq/L (ref 3.5–5.1)
Sodium: 140 mEq/L (ref 135–145)

## 2013-05-21 LAB — TSH: TSH: 2.42 u[IU]/mL (ref 0.35–5.50)

## 2013-05-21 LAB — PSA: PSA: 0.74 ng/mL (ref 0.10–4.00)

## 2013-05-21 NOTE — Assessment & Plan Note (Signed)
Pt's PE WNL.  UTD on colonoscopy.  Check labs.  Anticipatory guidance provided.  

## 2013-05-22 ENCOUNTER — Ambulatory Visit: Payer: BC Managed Care – PPO

## 2013-05-22 DIAGNOSIS — R7309 Other abnormal glucose: Secondary | ICD-10-CM

## 2013-05-22 LAB — HEMOGLOBIN A1C: Hgb A1c MFr Bld: 7 % — ABNORMAL HIGH (ref 4.6–6.5)

## 2013-05-23 ENCOUNTER — Other Ambulatory Visit: Payer: Self-pay | Admitting: General Practice

## 2013-05-23 MED ORDER — METFORMIN HCL 500 MG PO TABS: 500.0000 mg | ORAL_TABLET | Freq: Two times a day (BID) | ORAL | Status: DC

## 2013-08-28 ENCOUNTER — Ambulatory Visit (INDEPENDENT_AMBULATORY_CARE_PROVIDER_SITE_OTHER): Payer: BC Managed Care – PPO | Admitting: Family Medicine

## 2013-08-28 ENCOUNTER — Encounter: Payer: Self-pay | Admitting: General Practice

## 2013-08-28 ENCOUNTER — Encounter: Payer: Self-pay | Admitting: Family Medicine

## 2013-08-28 VITALS — BP 120/80 | HR 78 | Temp 98.2°F | Resp 16 | Wt 168.5 lb

## 2013-08-28 DIAGNOSIS — E119 Type 2 diabetes mellitus without complications: Secondary | ICD-10-CM | POA: Insufficient documentation

## 2013-08-28 LAB — HEMOGLOBIN A1C: Hgb A1c MFr Bld: 5.6 % (ref 4.6–6.5)

## 2013-08-28 LAB — BASIC METABOLIC PANEL
BUN: 16 mg/dL (ref 6–23)
CALCIUM: 9.3 mg/dL (ref 8.4–10.5)
CO2: 27 mEq/L (ref 19–32)
Chloride: 104 mEq/L (ref 96–112)
Creatinine, Ser: 1 mg/dL (ref 0.4–1.5)
GFR: 82.27 mL/min (ref >60.00)
Glucose, Bld: 145 mg/dL — ABNORMAL HIGH (ref 70–99)
POTASSIUM: 4.6 meq/L (ref 3.5–5.1)
Sodium: 137 mEq/L (ref 135–145)

## 2013-08-28 MED ORDER — PREGABALIN 75 MG PO CAPS: 75.0000 mg | ORAL_CAPSULE | Freq: Two times a day (BID) | ORAL | Status: DC

## 2013-08-28 MED ORDER — METFORMIN HCL 500 MG PO TABS: 500.0000 mg | ORAL_TABLET | Freq: Two times a day (BID) | ORAL | Status: DC

## 2013-08-28 NOTE — Progress Notes (Signed)
Pre visit review using our clinic review tool, if applicable. No additional management support is needed unless otherwise documented below in the visit note. 

## 2013-08-28 NOTE — Progress Notes (Signed)
   Subjective:    Patient ID: Fredonia Highlanddward Sanden, male    DOB: [birth year], 63 y.o.   MRN: [id number]  HPI DM- new dx at last visit.  Started on Metformin.  Needs refills on Metformin.  Denies abd pain, N/V/D, CP, SOB, HAs, visual changes.  3 or 4 symptomatic lows.  UTD on eye exam- coming due.  Has lost 17 lbs since last visit.   Review of Systems For ROS see HPI     Objective:   Physical Exam  Vitals reviewed. Constitutional: He is oriented to person, place, and time. He appears well-developed and well-nourished. No distress.  HENT:  Head: Normocephalic and atraumatic.  Eyes: Conjunctivae and EOM are normal. Pupils are equal, round, and reactive to light.  Neck: Normal range of motion. Neck supple. No thyromegaly present.  Cardiovascular: Normal rate, regular rhythm, normal heart sounds and intact distal pulses.   No murmur heard. Pulmonary/Chest: Effort normal and breath sounds normal. No respiratory distress.  Abdominal: Soft. Bowel sounds are normal. He exhibits no distension.  Musculoskeletal: He exhibits no edema.  Lymphadenopathy:    He has no cervical adenopathy.  Neurological: He is alert and oriented to person, place, and time. No cranial nerve deficit.  Skin: Skin is warm and dry.  Psychiatric: He has a normal mood and affect. His behavior is normal.          Assessment & Plan:

## 2013-08-28 NOTE — Assessment & Plan Note (Signed)
New dx at last visit.  Tolerating metformin w/o difficulty.  Has lost 17 lbs.  Check labs.  Adjust meds prn.

## 2013-08-28 NOTE — Patient Instructions (Signed)
Follow up in 3-4 months to recheck sugar and cholesterol We'll notify you of your lab results and make any changes if needed Keep up the good work on healthy diet and regular exercise- you look great! Call with any questions or concerns Happy Spring!

## 2013-09-10 ENCOUNTER — Telehealth: Payer: Self-pay

## 2013-09-10 NOTE — Telephone Encounter (Signed)
Relevant patient education mailed to patient.  

## 2013-09-17 ENCOUNTER — Other Ambulatory Visit: Payer: Self-pay | Admitting: Family Medicine

## 2013-09-18 NOTE — Telephone Encounter (Signed)
Med filled.  

## 2014-01-03 ENCOUNTER — Ambulatory Visit: Payer: BC Managed Care – PPO | Admitting: Family Medicine

## 2014-01-07 ENCOUNTER — Emergency Department (HOSPITAL_BASED_OUTPATIENT_CLINIC_OR_DEPARTMENT_OTHER)
Admission: EM | Admit: 2014-01-07 | Discharge: 2014-01-07 | Disposition: A | Discharge status: Home / Self Care | Payer: BC Managed Care – PPO | Attending: Emergency Medicine | Admitting: Emergency Medicine

## 2014-01-07 ENCOUNTER — Encounter (HOSPITAL_BASED_OUTPATIENT_CLINIC_OR_DEPARTMENT_OTHER): Payer: Self-pay | Admitting: Emergency Medicine

## 2014-01-07 ENCOUNTER — Emergency Department (HOSPITAL_BASED_OUTPATIENT_CLINIC_OR_DEPARTMENT_OTHER): Payer: BC Managed Care – PPO

## 2014-01-07 DIAGNOSIS — Z79899 Other long term (current) drug therapy: Secondary | ICD-10-CM | POA: Insufficient documentation

## 2014-01-07 DIAGNOSIS — I82409 Acute embolism and thrombosis of unspecified deep veins of unspecified lower extremity: Secondary | ICD-10-CM | POA: Insufficient documentation

## 2014-01-07 DIAGNOSIS — M1710 Unilateral primary osteoarthritis, unspecified knee: Secondary | ICD-10-CM | POA: Insufficient documentation

## 2014-01-07 DIAGNOSIS — Z7901 Long term (current) use of anticoagulants: Secondary | ICD-10-CM | POA: Diagnosis not present

## 2014-01-07 DIAGNOSIS — Z9889 Other specified postprocedural states: Secondary | ICD-10-CM | POA: Diagnosis not present

## 2014-01-07 DIAGNOSIS — I82402 Acute embolism and thrombosis of unspecified deep veins of left lower extremity: Secondary | ICD-10-CM

## 2014-01-07 DIAGNOSIS — Z96659 Presence of unspecified artificial knee joint: Secondary | ICD-10-CM | POA: Insufficient documentation

## 2014-01-07 DIAGNOSIS — M25579 Pain in unspecified ankle and joints of unspecified foot: Secondary | ICD-10-CM | POA: Insufficient documentation

## 2014-01-07 DIAGNOSIS — Z8619 Personal history of other infectious and parasitic diseases: Secondary | ICD-10-CM | POA: Insufficient documentation

## 2014-01-07 DIAGNOSIS — Z8669 Personal history of other diseases of the nervous system and sense organs: Secondary | ICD-10-CM | POA: Insufficient documentation

## 2014-01-07 DIAGNOSIS — I82812 Embolism and thrombosis of superficial veins of left lower extremities: Secondary | ICD-10-CM

## 2014-01-07 DIAGNOSIS — F17200 Nicotine dependence, unspecified, uncomplicated: Secondary | ICD-10-CM | POA: Insufficient documentation

## 2014-01-07 DIAGNOSIS — IMO0002 Reserved for concepts with insufficient information to code with codable children: Secondary | ICD-10-CM

## 2014-01-07 DIAGNOSIS — I82819 Embolism and thrombosis of superficial veins of unspecified lower extremities: Secondary | ICD-10-CM | POA: Insufficient documentation

## 2014-01-07 DIAGNOSIS — Z8612 Personal history of poliomyelitis: Secondary | ICD-10-CM | POA: Insufficient documentation

## 2014-01-07 MED ORDER — HYDROCODONE-ACETAMINOPHEN 5-325 MG PO TABS: 1.0000 | ORAL_TABLET | ORAL | Status: DC | PRN

## 2014-01-07 NOTE — ED Notes (Signed)
Patient transported to Ultrasound via stretcher 

## 2014-01-07 NOTE — ED Notes (Signed)
Left lateral ankle pain worsening with weight bearing.  Onset this am at 0400. Denies injury or SHOB, however reports a 2 hours ride in the car yesterday.

## 2014-01-07 NOTE — ED Provider Notes (Signed)
Pt presents with LLE pain.  H/o Post polio with multiple past procedures.  No recent procedures. H/O LLE DVT, currently on Xaralto.  Point tender to Lt ankle, lateral, posteriorly, without trauma.  Doppler shows saphenous SVT, and posterior tibial DVT. All in the calf, not extending above the knee. I don't feel he needs acute changes right now. I recommend he receive followup ultrasound to exclude propagation within the next week. Continuous or altered. Continue activity. Warm compresses. He is a routine appointment with his primary care physician tomorrow recommended he keep this to schedule the outpatient followup. He is not short of breath. He has no chest pain. Is not dyspneic. He is not tachycardic or hypoxemic. Do not feel it represents pulmonary embolus. A below the knee SVT, DVT would be low risk for embolization.  Rolland Porter, MD 01/07/14 1314

## 2014-01-07 NOTE — ED Provider Notes (Signed)
CSN: [id number]     Arrival date & time 01/07/14  0902 History   First MD Initiated Contact with Patient 01/07/14 [phone]   History provided by patient. Wife at bedside.   Chief Complaint  Patient presents with  . Ankle Pain   HPI  Patient reports Left ankle pain, acute onset this morning at 0400, described as sharp stabbing localized pain without radiation behind left lateral ankle, pain is not present at rest while off feet, only experiences severe pain with weight bearing, admits to being unable to ambulate due to pain. Denies any injury, trauma, accident. Yesterday patient able to ambulate without pain. Admits to localized ankle edema without redness. Admits to chronic Left foot numbness/tingling in toes (unchanged). Denies any associated fevers/chills, CP, SOB, other joint pain or swelling, rash. Additionally reported recent event with sitting in traffic without activity for >2 hours yesterday, otherwise normally active with frequent bike riding.  Significant PMH with DM2 (diet controlled), hx Polio at age 70, s/p multiple left lower extremity surgeries (states Left ankle fused) due to chronic underdeveloped left leg. Additionally, significant recent history with new dx Left lower leg DVT in 02/2013 following acute episode of ankle pain and swelling, reportedly very similar to current episode. Treated with Xarelto, admits to still taking Xarelto  daily (denies recent missed doses over past month, but has missed 1 or 2 doses before).  Past Medical History  Diagnosis Date  . Arthritis of knee     right  . History of chicken pox   . Jaundice   . Cataract     left  . Polio     as a child  . DVT (deep venous thrombosis)    Past Surgical History  Procedure Laterality Date  . Retinal detachment surgery      left eye  . Knee arthroscopy      right knee x2  . Leg surgery      several, left secondary to polio  . Tonsillectomy    . Cataract extraction      left   Family History   Problem Relation Age of Onset  . Hypertension Mother   . Diabetes Brother   . Heart disease Brother    History  Substance Use Topics  . Smoking status: Current Some Day Smoker    Types: Cigars  . Smokeless tobacco: Not on file  . Alcohol Use: No    Review of Systems  See above HPI   Allergies  Review of patient's allergies indicates no known allergies.  Home Medications   Prior to Admission medications   Medication Sig Start Date End Date Taking? Authorizing Provider  pregabalin (LYRICA) 75 MG capsule Take 1 capsule (75 mg total) by mouth 2 (two) times daily. 08/28/13  Yes Sheliah Hatch, MD  XARELTO 20 MG TABS tablet TAKE 1 TABLET EVERY DAY   Yes Sheliah Hatch, MD  HYDROcodone-acetaminophen (NORCO) 5-325 MG per tablet Take 1-2 tablets by mouth every 4 (four) hours as needed. 01/07/14   Saralyn Pilar, DO  metFORMIN (GLUCOPHAGE) 500 MG tablet Take 1 tablet (500 mg total) by mouth 2 (two) times daily with a meal. 08/28/13   Sheliah Hatch, MD   BP 149/108  Pulse 64  Temp(Src) 97.7 F (36.5 C) (Oral)  Resp 16  Wt 168 lb (76.204 kg)  SpO2 100% Physical Exam  Gen - well-appearing, cooperative, NAD Heart - RRR, no murmurs heard Lungs - CTAB, no wheezing, crackles, or rhonchi. Normal work of  breathing. MSK / Ext - Left lower ext - chronic muscle atrophy significantly small size vs Right, multiple foot / ankle surgical scars, +mild tenderness reproducible to palpation left lateral posterior malleolus (point tenderness) no bony tenderness or calf tenderness, mild local edema without erythema, different to discern edema due to chronic appearance of leg. Left ankle with limited ROM (at baseline), increased pain at same site with dorsiflexion (no tenderness provoked on plantar flexion, or inversion/eversion), intact peripheral pulses Skin - warm, dry, no rashes Neuro - awake, alert, grossly non-focal, intact muscle strength 5/5 b/l (left lower ext 4/5), intact  distal sensation to light touch  ED Course  Procedures (including critical care time) Labs Review Labs Reviewed - No data to display  Imaging ReviewK[place] Us Venous Img Lower Unilateral Left  01/07/2014   CLINICAL DATA:  One day history of left ankle pain and swelling  EXAM: LEFT LOWER EXTREMITY VENOUS DOPPLER ULTRASOUND  TECHNIQUE: Gray-scale sonography with graded compression, as well as color Doppler and duplex ultrasound were performed to evaluate the lower extremity deep venous systems from the level of the common femoral vein and including the common femoral, femoral, profunda femoral, popliteal and calf veins including the posterior tibial, peroneal and gastrocnemius veins when visible. The superficial great saphenous vein was also interrogated. Spectral Doppler was utilized to evaluate flow at rest and with distal augmentation maneuvers in the common femoral, femoral and popliteal veins.  COMPARISON:  None.  FINDINGS: Common Femoral Vein: No evidence of thrombus. Normal compressibility, respiratory phasicity and response to augmentation.  Saphenofemoral Junction: No evidence of thrombus. Normal compressibility and flow on color Doppler imaging.  Profunda Femoral Vein: No evidence of thrombus. Normal compressibility and flow on color Doppler imaging.  Femoral Vein: No evidence of thrombus. Normal compressibility, respiratory phasicity and response to augmentation.  Popliteal Vein: No evidence of thrombus. Normal compressibility, respiratory phasicity and response to augmentation.  Calf Veins: The posterior tibial vein is not compressible. Echogenic material consistent with thrombus is present within the vessel lumen. No flow on color Doppler imaging.  Superficial Great Saphenous Vein: Incompressible great saphenous vein from the mid calf to the level of the ankle. Thrombus is present within the great saphenous vein.  Venous Reflux:  None.  Other Findings:  None.  IMPRESSION: Positive for a combination of  deep and superficial venous thrombosis in the calf. The deep posterior tibial veins are thrombosed as is the superficial great saphenous vein in the mid calf and ankle.  No extension of thrombus proximally above the knee.  These results were called by telephone at the time of interpretation on 01/07/2014 at 10:44 am to Dr. Rolland Porter , who verbally acknowledged these results.   Electronically Signed   By: Malachy Moan M.D.   On: 01/07/2014 10:45     EKG Interpretation None      MDM   Final diagnoses:  DVT (deep venous thrombosis), left  Superficial thrombosis of left lower extremity   62 yr M with significant PMH LLE DVT (02/2013) currently on Xarelto, Polio age 81 (chronic LLE atrophy, s/p multiple surgeries on ankle, fusion), hx DM2 (diet controlled) with peripheral neuropathy, presents with acute onset Left ankle (posterior lateral malleolus) with sharp point tenderness on weightbearing and minimal pain at rest, associated with mild local edema, no erythema. Concern for Left Lower Ext repeat DVT, however unclear of etiology for occurrence while on Xarelto. Unlikely PE with stable vitals, no tachycardia, SpO2 100%, non-focal pulm exam, history not suggestive.  Ordered Left Lower Ext Duplex US to eval for DVT.  UDPATE @ 1045 - LLE [place] positive for a "combination of deep and superficial venous thrombosis  in the calf. The deep posterior tibial veins are thrombosed as is the superficial great saphenous vein in the mid calf and ankle. No extension of thrombus proximally above the knee." Discussed with patient.  Discharge patient to home, continue Xarelto, rx Norco PRN pain, advised avoid weight bearing on affected limb, already has close f/u with PCP tomorrow, advised schedule f/u with PCP for repeat LLE [place] in 1 week. Return precautions given.    Saralyn Pilar, DO 01/07/14 1123

## 2014-01-07 NOTE — Discharge Instructions (Signed)
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 At this time we do not recommend changing Anticoagulation medicines, but this may be necessary to switch in the future if worsening symptoms, or clots above knee, or un-resolving clots.  Prescribed Norco for pain, take as needed. Try to avoid weight bearing activity, may continue to ride your bike for activity.  Please keep your follow-up with your Primary Doctor tomorrow, and also schedule a follow-up with your doctor in 1 week to repeat the Left Lower Extremity Ultrasound to determine if clot is improved, worse, or advanced above knee (in which case you would likely need a referral for targeted therapy or change in anticoagulation).  If your symptoms significantly worse with pain, swelling, redness, shortness of breath, chest pain, or new symptoms, please call your doctor, otherwise you may return to the Emergency Department for further evaluation.   Deep Vein Thrombosis A deep vein thrombosis (DVT) is a blood clot that develops in the deep, larger veins of the leg, arm, or pelvis. These are more dangerous than clots that might form in veins near the surface of the body. A DVT can lead to serious and even life-threatening complications if the clot breaks off and travels in the bloodstream to the lungs.  A DVT can damage the valves in your leg veins so that instead of flowing upward, the blood pools in the lower leg. This is called post-thrombotic syndrome, and it can result in pain, swelling, discoloration, and sores on the leg. CAUSES Usually, several things contribute to the formation of blood clots. Contributing factors include:  The flow of blood slows down.  The inside of the vein is damaged in some way.  You have a condition that makes blood clot more easily. RISK FACTORS Some people are  more likely than others to develop blood clots. Risk factors include:   Smoking.  Being overweight (obese).  Sitting or lying still for a long time. This includes long-distance travel, paralysis, or recovery from an illness or surgery. Other factors that increase risk are:   Older age, especially over 34 years of age.  Having a family history of blood clots or if you have already had a blot clot.  Having major or lengthy surgery. This is especially true for surgery on the hip, knee, or belly (abdomen). Hip surgery is particularly high risk.  Having a long, thin tube (catheter) placed inside a vein during a medical procedure.  Breaking a hip or leg.  Having cancer or cancer treatment.  Pregnancy and childbirth.  Hormone changes make the blood clot more easily during pregnancy.  The fetus puts pressure on the veins of the pelvis.  There is a risk of injury to veins during delivery or a caesarean delivery. The risk is highest just after childbirth.  Medicines containing the male hormone estrogen. This includes birth control pills and hormone replacement therapy.  Other circulation or heart problems.  SIGNS AND SYMPTOMS When a clot forms, it can either partially or totally block the blood flow in that vein. Symptoms of a DVT can include:  Swelling of the leg or arm, especially if one side is much worse.  Warmth and redness of the leg or arm, especially if one side is much worse.  Pain in an arm or leg. If the clot is in the leg, symptoms may be more noticeable or  worse when standing or walking. The symptoms of a DVT that has traveled to the lungs (pulmonary embolism, PE) usually start suddenly and include:  Shortness of breath.  Coughing.  Coughing up blood or blood-tinged mucus.  Chest pain. The chest pain is often worse with deep breaths.  Rapid heartbeat. Anyone with these symptoms should get emergency medical treatment right away. Do not wait to see if the  symptoms will go away. Call your local emergency services (911 in the [place] if you have these symptoms. Do not drive yourself to the hospital. DIAGNOSIS If a DVT is suspected, your health care provider will take a full medical history and perform a physical exam. Tests that also may be required include:  Blood tests, including studies of the clotting properties of the blood.  Ultrasound to see if you have clots in your legs or lungs.  X-rays to show the flow of blood when dye is injected into the veins (venogram).  Studies of your lungs if you have any chest symptoms. PREVENTION  Exercise the legs regularly. Take a brisk 30-minute walk every day.  Maintain a weight that is appropriate for your height.  Avoid sitting or lying in bed for long periods of time without moving your legs.  Women, particularly those over the age of 35 years, should consider the risks and benefits of taking estrogen medicines, including birth control pills.  Do not smoke, especially if you take estrogen medicines.  Long-distance travel can increase your risk of DVT. You should exercise your legs by walking or pumping the muscles every hour.  Many of the risk factors above relate to situations that exist with hospitalization, either for illness, injury, or elective surgery. Prevention may include medical and nonmedical measures.  Your health care provider will assess you for the need for venous thromboembolism prevention when you are admitted to the hospital. If you are having surgery, your surgeon will assess you the day of or day after surgery. TREATMENT Once identified, a DVT can be treated. It can also be prevented in some circumstances. Once you have had a DVT, you may be at increased risk for a DVT in the future. The most common treatment for DVT is blood-thinning (anticoagulant) medicine, which reduces the blood's tendency to clot. Anticoagulants can stop new blood clots from forming and stop old clots from  growing. They cannot dissolve existing clots. Your body does this by itself over time. Anticoagulants can be given by mouth, through an IV tube, or by injection. Your health care provider will determine the best program for you. Other medicines or treatments that may be used are:  Heparin or related medicines (low molecular weight heparin) are often the first treatment for a blood clot. They act quickly. However, they cannot be taken orally and must be given either in shot form or by IV tube.  Heparin can cause a fall in a component of blood that stops bleeding and forms blood clots (platelets). You will be monitored with blood tests to be sure this does not occur.  Warfarin is an anticoagulant that can be swallowed. It takes a few days to start working, so usually heparin or related medicines are used in combination. Once warfarin is working, heparin is usually stopped.  Factor Xa inhibitor medicines, such as rivaroxaban and apixaban, also reduce blood clotting. These medicines are taken orally and can often be used without heparin or related medicines.  Less commonly, clot dissolving drugs (thrombolytics) are used to dissolve a DVT. They  carry a high risk of bleeding, so they are used mainly in severe cases where your life or a part of your body is threatened.  Very rarely, a blood clot in the leg needs to be removed surgically.  If you are unable to take anticoagulants, your health care provider may arrange for you to have a filter placed in a main vein in your abdomen. This filter prevents clots from traveling to your lungs. HOME CARE INSTRUCTIONS  Take all medicines as directed by your health care provider.  Learn as much as you can about DVT.  Wear a medical alert bracelet or carry a medical alert card.  Ask your health care provider how soon you can go back to normal activities. It is important to stay active to prevent blood clots. If you are on anticoagulant medicine, avoid contact  sports.  It is very important to exercise. This is especially important while traveling, sitting, or standing for long periods of time. Exercise your legs by walking or by tightening and relaxing your leg muscles regularly. Take frequent walks.  You may need to wear compression stockings. These are tight elastic stockings that apply pressure to the lower legs. This pressure can help keep the blood in the legs from clotting. Taking Warfarin Warfarin is a daily medicine that is taken by mouth. Your health care provider will advise you on the length of treatment (usually 3-6 months, sometimes lifelong). If you take warfarin:  Understand how to take warfarin and foods that can affect how warfarin works in Public relations account executive.  Too much and too little warfarin are both dangerous. Too much warfarin increases the risk of bleeding. Too little warfarin continues to allow the risk for blood clots. Warfarin and Regular Blood Testing While taking warfarin, you will need to have regular blood tests to measure your blood clotting time. These blood tests usually include both the prothrombin time (PT) and international normalized ratio (INR) tests. The PT and INR results allow your health care provider to adjust your dose of warfarin. It is very important that you have your PT and INR tested as often as directed by your health care provider.  Warfarin and Your Diet Avoid major changes in your diet, or notify your health care provider before changing your diet. Arrange a visit with a registered dietitian to answer your questions. Many foods, especially foods high in vitamin K, can interfere with warfarin and affect the PT and INR results. You should eat a consistent amount of foods high in vitamin K. Foods high in vitamin K include:   Spinach, kale, broccoli, cabbage, collard and turnip greens, Brussels sprouts, peas, cauliflower, seaweed, and parsley.  Beef and pork liver.  Green tea.  Soybean oil. Warfarin with  Other Medicines Many medicines can interfere with warfarin and affect the PT and INR results. You must:  Tell your health care provider about any and all medicines, vitamins, and supplements you take, including aspirin and other over-the-counter anti-inflammatory medicines. Be especially cautious with aspirin and anti-inflammatory medicines. Ask your health care provider before taking these.  Do not take or discontinue any prescribed or over-the-counter medicine except on the advice of your health care provider or pharmacist. Warfarin Side Effects Warfarin can have side effects, such as easy bruising and difficulty stopping bleeding. Ask your health care provider or pharmacist about other side effects of warfarin. You will need to:  Hold pressure over cuts for longer than usual.  Notify your dentist and other health care providers that  you are taking warfarin before you undergo any procedures where bleeding may occur. Warfarin with Alcohol and Tobacco   Drinking alcohol frequently can increase the effect of warfarin, leading to excess bleeding. It is best to avoid alcoholic drinks or to consume only very small amounts while taking warfarin. Notify your health care provider if you change your alcohol intake.   Do not use any tobacco products including cigarettes, chewing tobacco, or electronic cigarettes. If you smoke, quit. Ask your health care provider for help with quitting smoking. Alternative Medicines to Warfarin: Factor Xa Inhibitor Medicines  These blood-thinning medicines are taken by mouth, usually for several weeks or longer. It is important to take the medicine every single day at the same time each day.  There are no regular blood tests required when using these medicines.  There are fewer food and drug interactions than with warfarin.  The side effects of this class of medicine are similar to those of warfarin, including excessive bruising or bleeding. Ask your health care  provider or pharmacist about other potential side effects. SEEK MEDICAL CARE IF:  You notice a rapid heartbeat.  You feel weaker or more tired than usual.  You feel faint.  You notice increased bruising.  You feel your symptoms are not getting better in the time expected.  You believe you are having side effects of medicine. SEEK IMMEDIATE MEDICAL CARE IF:  You have chest pain.  You have trouble breathing.  You have new or increased swelling or pain in one leg.  You cough up blood.  You notice blood in vomit, in a bowel movement, or in urine. MAKE SURE YOU:  Understand these instructions.  Will watch your condition.  Will get help right away if you are not doing well or get worse. Document Released: 05/02/2005 Document Revised: 09/16/2013 Document Reviewed: 01/07/2013 [facility] Patient Information 2015 [place], [place]. This information is not intended to replace advice given to you by your health care provider. Make sure you discuss any questions you have with your health care provider.

## 2014-01-08 ENCOUNTER — Telehealth: Payer: Self-pay | Admitting: Hematology & Oncology

## 2014-01-08 ENCOUNTER — Ambulatory Visit (INDEPENDENT_AMBULATORY_CARE_PROVIDER_SITE_OTHER): Payer: BC Managed Care – PPO | Admitting: Family Medicine

## 2014-01-08 ENCOUNTER — Encounter: Payer: Self-pay | Admitting: Family Medicine

## 2014-01-08 VITALS — BP 130/78 | HR 64 | Temp 97.9°F | Resp 16 | Wt 165.1 lb

## 2014-01-08 DIAGNOSIS — E119 Type 2 diabetes mellitus without complications: Secondary | ICD-10-CM

## 2014-01-08 DIAGNOSIS — I82402 Acute embolism and thrombosis of unspecified deep veins of left lower extremity: Secondary | ICD-10-CM

## 2014-01-08 DIAGNOSIS — I82409 Acute embolism and thrombosis of unspecified deep veins of unspecified lower extremity: Secondary | ICD-10-CM

## 2014-01-08 LAB — HEPATIC FUNCTION PANEL
ALT: 14 U/L (ref 0–53)
AST: 18 U/L (ref 0–37)
Albumin: 3.8 g/dL (ref 3.5–5.2)
Alkaline Phosphatase: 33 U/L — ABNORMAL LOW (ref 39–117)
BILIRUBIN TOTAL: 0.7 mg/dL (ref 0.2–1.2)
Bilirubin, Direct: 0 mg/dL (ref 0.0–0.3)
Total Protein: 6.9 g/dL (ref 6.0–8.3)

## 2014-01-08 LAB — LIPID PANEL
CHOLESTEROL: 173 mg/dL (ref 0–200)
HDL: 48.9 mg/dL (ref >39.00)
LDL Cholesterol: 97 mg/dL (ref 0–99)
NonHDL: 124.1
TRIGLYCERIDES: 138 mg/dL (ref 0.0–149.0)
Total CHOL/HDL Ratio: 4
VLDL: 27.6 mg/dL (ref 0.0–40.0)

## 2014-01-08 LAB — BASIC METABOLIC PANEL
BUN: 16 mg/dL (ref 6–23)
CO2: 26 mEq/L (ref 19–32)
CREATININE: 1.1 mg/dL (ref 0.4–1.5)
Calcium: 9.3 mg/dL (ref 8.4–10.5)
Chloride: 102 mEq/L (ref 96–112)
GFR: 74.25 mL/min (ref >60.00)
Glucose, Bld: 104 mg/dL — ABNORMAL HIGH (ref 70–99)
Potassium: 4.2 mEq/L (ref 3.5–5.1)
Sodium: 136 mEq/L (ref 135–145)

## 2014-01-08 LAB — TSH: TSH: 2.49 u[IU]/mL (ref 0.35–4.50)

## 2014-01-08 LAB — HEMOGLOBIN A1C: Hgb A1c MFr Bld: 6.2 % (ref 4.6–6.5)

## 2014-01-08 MED ORDER — RIVAROXABAN 15 MG PO TABS: 15.0000 mg | ORAL_TABLET | Freq: Two times a day (BID) | ORAL | Status: DC

## 2014-01-08 NOTE — Patient Instructions (Signed)
Increase your Xarelto back to  twice daily x21 days We'll call you with your Hematology appt (do NOT freak out when they call- they are located in the cancer center) HEAT! Hydrocodone as needed for pain We'll notify you of your lab results and make any changes Activity as you are able Call with any questions or concerns Hang in there!

## 2014-01-08 NOTE — Progress Notes (Signed)
   Subjective:    Patient ID: Connor Griffith, male    DOB: 07/22/1950, 63 y.o.   MRN: [id number]  HPI DVT- pt had 1st DVT in October in L leg.  Was started on Xarelto and plan was anti-coag x6 months- ending in May.  Pt continued this on his own.  Pt went to ER yesterday for severe leg pain after 2 hr car ride and inability to bear weight.  Pt was given hydrocodone in ER but pt reports this is not effective.  Pain was similar to pain from 1st DVT.  Pt was found to have DVT in posterior tibial vein and the superficial great saphenous vein.  Pt is occasional cigar smoker.    DM- ongoing issue, on Metformin.  Denies symptomatic lows.  No CP, SOB, HAs, visual changes, edema.  No symptomatic lows.   Review of Systems For ROS see HPI     Objective:   Physical Exam  Vitals reviewed. Constitutional: He is oriented to person, place, and time. He appears well-developed and well-nourished. No distress.  HENT:  Head: Normocephalic and atraumatic.  Eyes: Conjunctivae and EOM are normal. Pupils are equal, round, and reactive to light.  Neck: Normal range of motion. Neck supple. No thyromegaly present.  Cardiovascular: Normal rate, regular rhythm, normal heart sounds and intact distal pulses.   No murmur heard. Pulmonary/Chest: Effort normal and breath sounds normal. No respiratory distress.  Abdominal: Soft. Bowel sounds are normal. He exhibits no distension.  Musculoskeletal: He exhibits edema (L LE). He exhibits no tenderness (minimal TTP over LLE but severe pain w/ weight bearing).  Lymphadenopathy:    He has no cervical adenopathy.  Neurological: He is alert and oriented to person, place, and time. No cranial nerve deficit.  Skin: Skin is warm and dry.  Psychiatric: He has a normal mood and affect. His behavior is normal.          Assessment & Plan:

## 2014-01-08 NOTE — Assessment & Plan Note (Signed)
Chronic problem.  Tolerating Metformin w/o difficulty.  Check labs.  Adjust meds prn

## 2014-01-08 NOTE — Assessment & Plan Note (Signed)
This is pt's 2nd DVT and this occurred while taking  Xarelto daily.  Will refer to heme for hypercoagulable w/u.  Increase Xarelto to  BID.  Continue pain meds prn.  Reviewed supportive care and red flags that should prompt return.  Pt expressed understanding and is in agreement w/ plan.

## 2014-01-08 NOTE — Telephone Encounter (Signed)
I spoke w NEW PATIENT today to remind them of their appointment with Dr. Ennever. Also, advised them to bring all medication bottles and insurance card information. ° °

## 2014-01-08 NOTE — Progress Notes (Signed)
Pre visit review using our clinic review tool, if applicable. No additional management support is needed unless otherwise documented below in the visit note. 

## 2014-01-09 ENCOUNTER — Encounter: Payer: Self-pay | Admitting: General Practice

## 2014-01-09 ENCOUNTER — Ambulatory Visit (HOSPITAL_BASED_OUTPATIENT_CLINIC_OR_DEPARTMENT_OTHER): Payer: BC Managed Care – PPO

## 2014-01-09 ENCOUNTER — Ambulatory Visit (HOSPITAL_BASED_OUTPATIENT_CLINIC_OR_DEPARTMENT_OTHER): Payer: BC Managed Care – PPO | Admitting: Lab

## 2014-01-09 ENCOUNTER — Ambulatory Visit (HOSPITAL_BASED_OUTPATIENT_CLINIC_OR_DEPARTMENT_OTHER): Payer: BC Managed Care – PPO | Admitting: Hematology & Oncology

## 2014-01-09 ENCOUNTER — Encounter: Payer: Self-pay | Admitting: Hematology & Oncology

## 2014-01-09 VITALS — BP 132/82 | HR 66 | Temp 97.2°F | Resp 18 | Ht 67.0 in | Wt 168.0 lb

## 2014-01-09 DIAGNOSIS — I82819 Embolism and thrombosis of superficial veins of unspecified lower extremities: Secondary | ICD-10-CM

## 2014-01-09 DIAGNOSIS — I824Z9 Acute embolism and thrombosis of unspecified deep veins of unspecified distal lower extremity: Secondary | ICD-10-CM

## 2014-01-09 DIAGNOSIS — I82402 Acute embolism and thrombosis of unspecified deep veins of left lower extremity: Secondary | ICD-10-CM

## 2014-01-09 LAB — CBC WITH DIFFERENTIAL (CANCER CENTER ONLY)
BASO#: 0 10*3/uL (ref 0.0–0.2)
BASO%: 0.6 % (ref 0.0–2.0)
EOS%: 2.3 % (ref 0.0–7.0)
Eosinophils Absolute: 0.2 10*3/uL (ref 0.0–0.5)
HEMATOCRIT: 43.9 % (ref 38.7–49.9)
HGB: 16 g/dL (ref 13.0–17.1)
LYMPH#: 2.1 10*3/uL (ref 0.9–3.3)
LYMPH%: 31.2 % (ref 14.0–48.0)
MCH: 31.3 pg (ref 28.0–33.4)
MCHC: 36.4 g/dL — ABNORMAL HIGH (ref 32.0–35.9)
MCV: 86 fL (ref 82–98)
MONO#: 0.5 10*3/uL (ref 0.1–0.9)
MONO%: 7.6 % (ref 0.0–13.0)
NEUT#: 3.9 10*3/uL (ref 1.5–6.5)
NEUT%: 58.3 % (ref 40.0–80.0)
PLATELETS: 223 10*3/uL (ref 145–400)
RBC: 5.11 10*6/uL (ref 4.20–5.70)
RDW: 12.1 % (ref 11.1–15.7)
WBC: 6.6 10*3/uL (ref 4.0–10.0)

## 2014-01-09 LAB — CMP (CANCER CENTER ONLY)
ALBUMIN: 3.4 g/dL (ref 3.3–5.5)
ALK PHOS: 29 U/L (ref 26–84)
ALT(SGPT): 16 U/L (ref 10–47)
AST: 16 U/L (ref 11–38)
BUN, Bld: 17 mg/dL (ref 7–22)
CO2: 25 mEq/L (ref 18–33)
Calcium: 9.2 mg/dL (ref 8.0–10.3)
Chloride: 101 mEq/L (ref 98–108)
Creat: 1 mg/dl (ref 0.6–1.2)
Glucose, Bld: 136 mg/dL — ABNORMAL HIGH (ref 73–118)
POTASSIUM: 4 meq/L (ref 3.3–4.7)
SODIUM: 137 meq/L (ref 128–145)
TOTAL PROTEIN: 6.9 g/dL (ref 6.4–8.1)
Total Bilirubin: 0.6 mg/dl (ref 0.20–1.60)

## 2014-01-09 LAB — CHCC SATELLITE - SMEAR

## 2014-01-09 MED ORDER — FONDAPARINUX SODIUM 7.5 MG/0.6ML ~~LOC~~ SOLN: 7.5000 mg | SUBCUTANEOUS | Status: DC

## 2014-01-09 NOTE — Progress Notes (Signed)
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 NEW PATIENT  : I have another blood clot a mild left leg.  HPI: Mr. Connor Griffith is a very nice 63 year old gentleman. He has a past history of polio. He has some atrophy of the left leg.  He's been pretty healthy. He's been fairly active. He's been doing a lot of bike riding. Back in October, he developed a blood clot in his left leg. I am not sure where in the later was. He put on Xarelto. He basically has been on Xarelto since he was diagnosed.  He then developed a lot of pain behind his left ankle. This was about 3 days ago. There was really no swelling. He then underwent a Doppler. He was found to have a thrombus in the posterior tibial vein. He also was noted to have a thrombus in the superficial greater saphenous vein. Again, he is still on the Xarelto.  He was then referred to the [facility] for an evaluation.  He's had a problem with cough. There's been no shortness of breath. He has had no chest pain. There's been no abdominal pain. He's had no change in bowel or bladder habits.  He has not had any problems with weight loss or weight gain. His diet has been okay.  His last colonoscopy was about 4 years ago.  His leg feels a little better today. When this first started, he really could not put any weight on it.  There is no family history of blood clots that he knows of.  He does not smoke. He does not drink.  He had a first wife who had breast cancer when she was 35 years old. She unfortunately passed away. His 2 daughters have been tested for the BRCA gene and were negative.         Past Medical History  Diagnosis Date  . Arthritis of knee     right  . History of chicken pox   . Jaundice   . Cataract     left  . Polio     as a child  . DVT (deep venous thrombosis)   :  Past Surgical History  Procedure Laterality Date  . Retinal detachment  surgery      left eye  . Knee arthroscopy      right knee x2  . Leg surgery      several, left secondary to polio  . Tonsillectomy    . Cataract extraction      left  :  Current outpatient prescriptions:HYDROcodone-acetaminophen (NORCO) 5-325 MG per tablet, Take 1-2 tablets by mouth every 4 (four) hours as needed., Disp: 30 tablet, Rfl: 0;  pregabalin (LYRICA) 75 MG capsule, Take 1 capsule (75 mg total) by mouth 2 (two) times daily., Disp: 60 capsule, Rfl: 3;  Rivaroxaban (XARELTO) 15 MG TABS tablet, Take 1 tablet (15 mg total) by mouth 2 (two) times daily with a meal., Disp: 42 tablet, Rfl: 0 fondaparinux (ARIXTRA) 7.5 MG/0.6ML SOLN injection, Inject 0.6 mLs (7.5 mg total) into the skin daily., Disp: 21 Syringe, Rfl: 0:  :  No Known Allergies:  Family History  Problem Relation Age of Onset  . Hypertension Mother   . Diabetes Brother   . Heart disease Brother   :  History   Social History  . Marital Status: Married    Spouse Name: N/A    Number of  Children: N/A  . Years of Education: N/A   Occupational History  . Not on file.   Social History Main Topics  . Smoking status: Current Some Day Smoker -- 20 years    Types: Cigars    Start date: 07/12/1993  . Smokeless tobacco: Never Used     Comment: smokes 2 or 3 cigars weekly  . Alcohol Use: No  . Drug Use: No  . Sexual Activity: Not on file   Other Topics Concern  . Not on file   Social History Narrative   Married, daughter [place]        :  Pertinent items are noted in HPI.  Exam: _0 @  well-developed and well-nourished white gentleman in no obvious distress. His vital signs show a temperature of 97.2. Pulse is 66. Blood pressure is 132/82. Weight is 168 pounds. His head and neck exam shows no ocular or oral lesions. He has no palpable bole cervical or supraclavicular lymph nodes. His lungs are clear bilaterally. Cardiac exam regular rate and rhythm with a normal S1 and S2. There are no murmurs rubs or  bruits. Abdomen is soft. He has good bowel sounds. There is no fluid wave. There is no palpable liver or spleen tip. Neck exam no tenderness over the spine ribs or hips. Extremities shows some atrophy of the left leg. No obvious venous cords noted. He has good strength. Has good pulses. Right leg is on remarkable. She has good strength. Neurological exam is nonfocal. Skin exam shows no rashes, ecchymosis or petechia.    Recent Labs  01/09/14 0959  WBC 6.6  HGB 16.0  HCT 43.9  PLT 223    Recent Labs  01/08/14 1031 01/09/14 0959  NA 136 137  K 4.2 4.0  CL 102 101  CO2 26 25  GLUCOSE 104* 136*  BUN 16 17  CREATININE 1.1 1.0  CALCIUM 9.3 9.2    Blood smear review: No data  Pathology: No data     Assessment and Plan: 63 year old gentleman with a DVT then developed well on Xarelto. He has not had any significant problems before October of last year.  I find it interesting that these thrombi are occurring in his left leg. I am somehow have to think that the polio that he had somehow must be a factor.  I am certainly checking him for any type of hypercoagulable issue. I with a, however, that it probably would be negative.  As far as anticoagulation goes, but these go-ahead to be on subcutaneous anticoagulation for right now. I think Arixtra would be simplest. Is daily. I think the dose of 7.5 mg would be appropriate.  One issue that we have is that he has a bike ride that he is involved with. This goes across the state of [place]. He will go for the mountains to [place] [place]. This starts the end of September. He really wants to go on this. I would think that this should be okay as he is being active and is using his legs and I would think this would help improve the blood flow through his legs.  Ultimately, I think that he will have to be on Coumadin. I would think that he may need lifelong anticoagulation.  I spent a good hour with him. He is very nice. Unfortunately, his  wife was down in the emergency room right now. She has her own set of medical issues that she is dealing with.  We went ahead and gave him  a dose of Lovenox while he was here. He get 80 mg. We taught how to do the Arixtra injection.  I asked all his questions. I will plan to get him back in 4 or 5 days. I want to see how he is doing.  If he wants to go on this bike ride, then we might  get a Doppler on him before he goes on his ride.

## 2014-01-10 ENCOUNTER — Other Ambulatory Visit: Payer: Self-pay | Admitting: Oncology

## 2014-01-10 DIAGNOSIS — I82402 Acute embolism and thrombosis of unspecified deep veins of left lower extremity: Secondary | ICD-10-CM

## 2014-01-11 NOTE — ED Provider Notes (Signed)
I saw and evaluated the patient, reviewed the resident's note and I agree with the findings and plan.   EKG Interpretation None        Rolland Porter, MD 01/11/14 [phone]

## 2014-01-13 ENCOUNTER — Ambulatory Visit (HOSPITAL_BASED_OUTPATIENT_CLINIC_OR_DEPARTMENT_OTHER): Payer: BC Managed Care – PPO | Admitting: Hematology & Oncology

## 2014-01-13 ENCOUNTER — Other Ambulatory Visit (HOSPITAL_BASED_OUTPATIENT_CLINIC_OR_DEPARTMENT_OTHER): Payer: BC Managed Care – PPO | Admitting: Lab

## 2014-01-13 ENCOUNTER — Encounter: Payer: Self-pay | Admitting: Hematology & Oncology

## 2014-01-13 ENCOUNTER — Ambulatory Visit: Payer: BC Managed Care – PPO | Admitting: Hematology & Oncology

## 2014-01-13 VITALS — BP 147/78 | HR 60 | Temp 97.1°F | Resp 18 | Ht 67.0 in | Wt 171.0 lb

## 2014-01-13 DIAGNOSIS — I82402 Acute embolism and thrombosis of unspecified deep veins of left lower extremity: Secondary | ICD-10-CM

## 2014-01-13 DIAGNOSIS — I825Z9 Chronic embolism and thrombosis of unspecified deep veins of unspecified distal lower extremity: Secondary | ICD-10-CM

## 2014-01-13 LAB — CBC WITH DIFFERENTIAL (CANCER CENTER ONLY)
BASO#: 0 10*3/uL (ref 0.0–0.2)
BASO%: 0.6 % (ref 0.0–2.0)
EOS%: 3.5 % (ref 0.0–7.0)
Eosinophils Absolute: 0.2 10*3/uL (ref 0.0–0.5)
HCT: 42 % (ref 38.7–49.9)
HGB: 15.1 g/dL (ref 13.0–17.1)
LYMPH#: 1.7 10*3/uL (ref 0.9–3.3)
LYMPH%: 27.3 % (ref 14.0–48.0)
MCH: 31.3 pg (ref 28.0–33.4)
MCHC: 36 g/dL — ABNORMAL HIGH (ref 32.0–35.9)
MCV: 87 fL (ref 82–98)
MONO#: 0.5 10*3/uL (ref 0.1–0.9)
MONO%: 8.4 % (ref 0.0–13.0)
NEUT#: 3.8 10*3/uL (ref 1.5–6.5)
NEUT%: 60.2 % (ref 40.0–80.0)
Platelets: 207 10*3/uL (ref 145–400)
RBC: 4.83 10*6/uL (ref 4.20–5.70)
RDW: 11.8 % (ref 11.1–15.7)
WBC: 6.3 10*3/uL (ref 4.0–10.0)

## 2014-01-13 LAB — HYPERCOAGULABLE PANEL, COMPREHENSIVE
ANTICARDIOLIPIN IGA: 9 U/mL (ref <22)
ANTICARDIOLIPIN IGG: 10 GPL U/mL (ref <23)
ANTICARDIOLIPIN IGM: 7 [MPL'U]/mL (ref <11)
ANTITHROMB III FUNC: 55 % — AB (ref 76–126)
BETA 2 GLYCO I IGG: 7 G Units (ref <20)
BETA-2-GLYCOPROTEIN I IGA: 111 A Units — AB (ref <20)
BETA-2-GLYCOPROTEIN I IGM: 19 M Units (ref <20)
DRVVT 1:1 Mix: 45.8 secs — ABNORMAL HIGH (ref <42.9)
DRVVT: 86.8 secs — ABNORMAL HIGH (ref <42.9)
Drvvt confirmation: 1.39 Ratio — ABNORMAL HIGH (ref <1.15)
Lupus Anticoagulant: DETECTED — AB
PROTEIN C, TOTAL: 115 % (ref 72–160)
PROTEIN S TOTAL: 90 % (ref 60–150)
PTT LA: 55.2 s — AB (ref 28.0–43.0)
PTTLA 41 MIX: 47.7 s — AB (ref 28.0–43.0)
PTTLA Confirmation: 0 secs (ref <8.0)
Protein C Activity: 191 % — ABNORMAL HIGH (ref 75–133)
Protein S Activity: 133 % — ABNORMAL HIGH (ref 69–129)

## 2014-01-13 NOTE — Progress Notes (Signed)
Hematology and Oncology Follow Up Visit  Connor Griffith [id number] 1950/05/23 63 y.o. 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 We first saw him just last week. He is able to give a Arixtra. His wife actually is given to him right now.  His left leg is feeling better. He's not as much pain. He is able to walk a little more.  We still don't have his hypercoagulable studies back yet. We will have to see how to go.  He really wants to go on a bike Connor Griffith also stayed late September. They leave September 26. I don't see a problem with him doing this. I think that we should him on the Arixtra through the bike race.  He's had no problems with bleeding. He's been no change in bowel or bladder habits. He's had no fever. He's had no cough. He's had no shortness of breath.  Medications: Current outpatient prescriptions:fondaparinux (ARIXTRA) 7.5 MG/0.6ML SOLN injection, Inject 0.6 mLs (7.5 mg total) into the skin daily., Disp: 21 Syringe, Rfl: 0;  HYDROcodone-acetaminophen (NORCO) 5-325 MG per tablet, Take 1-2 tablets by mouth every 4 (four) hours as needed., Disp: 30 tablet, Rfl: 0;  pregabalin (LYRICA) 75 MG capsule, Take 1 capsule (75 mg total) by mouth 2 (two) times daily., Disp: 60 capsule, Rfl: 3  Allergies: No Known Allergies  Past Medical History, Surgical history, Social history, and Family History were reviewed and updated.  Review of Systems: As above  Physical Exam:  height is  (1.702 m) and weight is 171 lb (77.565 kg). His oral temperature is 97.1 F (36.2 C). His blood pressure is 147/78 and his pulse is 60. His respiration is 18.   Well-developed and well-nourished white him a. Head and neck exam shows no ocular or oral lesion. He has no palpable cervical or supraclavicular lymph nodes.  Lungs are clear. Cardiac exam regular in rhythm with no murmurs rubs or bruits. Abdomen soft. He has good bowel sounds. There is no palpable liver or spleen tip. Extremities shows no clubbing, cyanosis or edema. He does have the atrophy in the left leg. No obvious his cord is noted in the left leg. Right leg is unremarkable. Skin exam shows no rashes, ecchymosis or petechia.  Lab Results  Component Value Date   WBC 6.3 01/13/2014   HGB 15.1 01/13/2014   HCT 42.0 01/13/2014   MCV 87 01/13/2014   PLT 207 01/13/2014     Chemistry      Component Value Date/Time   NA 137 01/09/2014 0959   NA 136 01/08/2014 1031   K 4.0 01/09/2014 0959   K 4.2 01/08/2014 1031   CL 101 01/09/2014 0959   CL 102 01/08/2014 1031   CO2 25 01/09/2014 0959   CO2 26 01/08/2014 1031   BUN 17 01/09/2014 0959   BUN 16 01/08/2014 1031   CREATININE 1.0 01/09/2014 0959   CREATININE 1.1 01/08/2014 1031      Component Value Date/Time   CALCIUM 9.2 01/09/2014 0959   CALCIUM 9.3 01/08/2014 1031   ALKPHOS 29 01/09/2014 0959   ALKPHOS 33* 01/08/2014 1031   AST 16 01/09/2014 0959   AST 18 01/08/2014 1031   ALT 16 01/09/2014 0959   ALT 14 01/08/2014 1031   BILITOT 0.60 01/09/2014 0959  BILITOT 0.7 01/08/2014 1031         Impression and Plan: Mr. Connor Griffith is a 63 year old male with a recurrent thrombus in the left leg. He was  actually on Xarelto when this happened. His first thrombus was in October.  Am still not sure as to why he had a recurrent thrombus. I do think that given the fact that he has polio and the fact that this second thrombus still in the left leg, there might be some anatomic issue..  I am checking his hypercoagulable studies.  I do want to repeat a Doppler before he goes on his bike ride. I want to get this date week of September 21.  We will continue him on the Arixtra through his bike race. We will then converted over to Coumadin.     Josph Macho, MD 8/31/201510:38 AM

## 2014-01-16 LAB — HEXAGONAL PHOSPHOLIPID NEUTRALIZATION: HEX PHOSPH NEUT TEST: NEGATIVE

## 2014-01-27 ENCOUNTER — Other Ambulatory Visit: Payer: Self-pay | Admitting: Family Medicine

## 2014-01-27 NOTE — Telephone Encounter (Signed)
Med filled and faxed.  

## 2014-01-27 NOTE — Telephone Encounter (Signed)
Last OV 01-08-14 Med filled 08-28-13 #60 with 3

## 2014-01-29 ENCOUNTER — Other Ambulatory Visit: Payer: Self-pay | Admitting: *Deleted

## 2014-01-29 DIAGNOSIS — I82402 Acute embolism and thrombosis of unspecified deep veins of left lower extremity: Secondary | ICD-10-CM

## 2014-01-29 MED ORDER — FONDAPARINUX SODIUM 7.5 MG/0.6ML ~~LOC~~ SOLN: 7.5000 mg | SUBCUTANEOUS | Status: DC

## 2014-02-04 ENCOUNTER — Ambulatory Visit (HOSPITAL_BASED_OUTPATIENT_CLINIC_OR_DEPARTMENT_OTHER): Payer: BC Managed Care – PPO | Admitting: Hematology & Oncology

## 2014-02-04 ENCOUNTER — Ambulatory Visit (HOSPITAL_BASED_OUTPATIENT_CLINIC_OR_DEPARTMENT_OTHER)
Admission: RE | Admit: 2014-02-04 | Discharge: 2014-02-04 | Disposition: A | Discharge status: Home / Self Care | Payer: BC Managed Care – PPO | Source: Ambulatory Visit | Attending: Hematology & Oncology | Admitting: Hematology & Oncology

## 2014-02-04 ENCOUNTER — Other Ambulatory Visit (HOSPITAL_BASED_OUTPATIENT_CLINIC_OR_DEPARTMENT_OTHER): Payer: BC Managed Care – PPO | Admitting: Lab

## 2014-02-04 ENCOUNTER — Encounter: Payer: Self-pay | Admitting: Hematology & Oncology

## 2014-02-04 VITALS — BP 141/76 | HR 56 | Temp 97.7°F | Resp 18 | Ht 67.0 in | Wt 171.0 lb

## 2014-02-04 DIAGNOSIS — I82409 Acute embolism and thrombosis of unspecified deep veins of unspecified lower extremity: Secondary | ICD-10-CM | POA: Diagnosis not present

## 2014-02-04 DIAGNOSIS — I824Z9 Acute embolism and thrombosis of unspecified deep veins of unspecified distal lower extremity: Secondary | ICD-10-CM

## 2014-02-04 DIAGNOSIS — D6859 Other primary thrombophilia: Secondary | ICD-10-CM

## 2014-02-04 DIAGNOSIS — I82402 Acute embolism and thrombosis of unspecified deep veins of left lower extremity: Secondary | ICD-10-CM

## 2014-02-04 LAB — CBC WITH DIFFERENTIAL (CANCER CENTER ONLY)
BASO#: 0 10*3/uL (ref 0.0–0.2)
BASO%: 0.6 % (ref 0.0–2.0)
EOS%: 4 % (ref 0.0–7.0)
Eosinophils Absolute: 0.2 10*3/uL (ref 0.0–0.5)
HEMATOCRIT: 40.1 % (ref 38.7–49.9)
HGB: 14.4 g/dL (ref 13.0–17.1)
LYMPH#: 2 10*3/uL (ref 0.9–3.3)
LYMPH%: 37.5 % (ref 14.0–48.0)
MCH: 31.5 pg (ref 28.0–33.4)
MCHC: 35.9 g/dL (ref 32.0–35.9)
MCV: 88 fL (ref 82–98)
MONO#: 0.6 10*3/uL (ref 0.1–0.9)
MONO%: 11 % (ref 0.0–13.0)
NEUT#: 2.5 10*3/uL (ref 1.5–6.5)
NEUT%: 46.9 % (ref 40.0–80.0)
Platelets: 192 10*3/uL (ref 145–400)
RBC: 4.57 10*6/uL (ref 4.20–5.70)
RDW: 12.2 % (ref 11.1–15.7)
WBC: 5.3 10*3/uL (ref 4.0–10.0)

## 2014-02-05 NOTE — Progress Notes (Signed)
Hematology and Oncology Follow Up Visit  Connor Griffith [id number] 11/27/1950 63 y.o. 02/05/2014   Principle Diagnosis:   DVT of the left lower leg  Factor V Leiden mutation-heterozygote  IgM anti-cardiolipin antibody  Current Therapy:   Arixtra 7.5 mg subcutaneous daily Aspirin 81 mg by mouth daily    Interim History:  Connor Griffith is back for followup. We found that he does have a heterozygous mutation for factor V Leiden. He also has a slightly elevated IgM anticardiolipin antibody.  He is a doing very well. He is getting ready for his bike ride across the state. This will be about a week. He does this weekend.  He has no pain in his left leg. He does have polio with some current chronic atrophy of the left lower leg.  We did do a Doppler today. He does have some chronic changes in the saphenous vein down by his ankle. Otherwise, any deep vein thrombus has resolved.   I told him to start baby aspirin daily.  He's had no bleeding.  His appetite is good. He's had no nausea vomiting.    Medications: Current outpatient prescriptions:fondaparinux (ARIXTRA) 7.5 MG/0.6ML SOLN injection, Inject 0.6 mLs (7.5 mg total) into the skin daily., Disp: 21 Syringe, Rfl: 0;  LYRICA 75 MG capsule, TAKE 1 CAPSULE BY MOUTH 2 TIMES DAILY, Disp: 60 capsule, Rfl: 1  Allergies: No Known Allergies  Past Medical History, Surgical history, Social history, and Family History were reviewed and updated.  Review of Systems: As above  Physical Exam:  height is  (1.702 m) and weight is 171 lb (77.565 kg). His oral temperature is 97.7 F (36.5 C). His blood pressure is 141/76 and his pulse is 56. His respiration is 18.   Well-developed well-nourished white gentleman. Head and neck exam shows no ocular or oral lesions. She has no palpable cervical or supraclavicular lymph nodes. Lungs are clear. Cardiac exam regular in rhythm with no murmurs rubs or bruits. Abdomen is soft. She has good bowel  sounds. There is no fluid wave. There is no palpable liver or spleen tip. Back exam no tenderness over the spine, ribs or hips. Extremities shows atrophy of the left lower leg. No obvious venous cord is noted. She has good range of motion of his joints. He has good strength. Skin exam no rashes. Neurological exam is non-focal.  Lab Results  Component Value Date   WBC 5.3 02/04/2014   HGB 14.4 02/04/2014   HCT 40.1 02/04/2014   MCV 88 02/04/2014   PLT 192 02/04/2014     Chemistry      Component Value Date/Time   NA 137 01/09/2014 0959   NA 136 01/08/2014 1031   K 4.0 01/09/2014 0959   K 4.2 01/08/2014 1031   CL 101 01/09/2014 0959   CL 102 01/08/2014 1031   CO2 25 01/09/2014 0959   CO2 26 01/08/2014 1031   BUN 17 01/09/2014 0959   BUN 16 01/08/2014 1031   CREATININE 1.0 01/09/2014 0959   CREATININE 1.1 01/08/2014 1031      Component Value Date/Time   CALCIUM 9.2 01/09/2014 0959   CALCIUM 9.3 01/08/2014 1031   ALKPHOS 29 01/09/2014 0959   ALKPHOS 33* 01/08/2014 1031   AST 16 01/09/2014 0959   AST 18 01/08/2014 1031   ALT 16 01/09/2014 0959   ALT 14 01/08/2014 1031   BILITOT 0.60 01/09/2014 0959   BILITOT 0.7 01/08/2014 1031         Impression  and Plan: Connor Griffith is 63 year old gentleman. He has factor V Leiden rotation. He also has a slightly elevated IgM anti-cardiolipin antibody.  If his thrombus is improved. She has no pain in the left leg.  I still think it would be okay to go on his bike ride. He is looking forward to this for quite a while. He takes his Arixtra with him.  I want to keep him on Arixtra until he gets back from his bike ride. I will then come over on to Coumadin.  I think that we probably will need 6 months-one year of anticoagulation.  I'll plan to get him back in about 3 weeks. By then, he'll be through with his bike ride.  Would not need another Doppler color for about 3 months.   Josph Macho, MD 9/23/20156:06 PM

## 2014-02-06 ENCOUNTER — Telehealth: Payer: Self-pay | Admitting: Hematology & Oncology

## 2014-02-06 NOTE — Telephone Encounter (Signed)
Left pt message with 10-14 appointment

## 2014-02-17 ENCOUNTER — Telehealth: Payer: Self-pay | Admitting: Hematology & Oncology

## 2014-02-17 NOTE — Telephone Encounter (Signed)
Pt aware of 10-6

## 2014-02-18 ENCOUNTER — Other Ambulatory Visit (HOSPITAL_BASED_OUTPATIENT_CLINIC_OR_DEPARTMENT_OTHER): Payer: BC Managed Care – PPO | Admitting: Lab

## 2014-02-18 ENCOUNTER — Encounter: Payer: Self-pay | Admitting: Hematology & Oncology

## 2014-02-18 ENCOUNTER — Ambulatory Visit (HOSPITAL_BASED_OUTPATIENT_CLINIC_OR_DEPARTMENT_OTHER): Payer: BC Managed Care – PPO | Admitting: Hematology & Oncology

## 2014-02-18 VITALS — BP 122/66 | HR 54 | Temp 97.5°F | Resp 18 | Ht 67.0 in | Wt 169.0 lb

## 2014-02-18 DIAGNOSIS — I824Z2 Acute embolism and thrombosis of unspecified deep veins of left distal lower extremity: Secondary | ICD-10-CM

## 2014-02-18 DIAGNOSIS — I82402 Acute embolism and thrombosis of unspecified deep veins of left lower extremity: Secondary | ICD-10-CM

## 2014-02-18 LAB — CBC WITH DIFFERENTIAL (CANCER CENTER ONLY)
BASO#: 0 10*3/uL (ref 0.0–0.2)
BASO%: 0.5 % (ref 0.0–2.0)
EOS%: 3.7 % (ref 0.0–7.0)
Eosinophils Absolute: 0.2 10*3/uL (ref 0.0–0.5)
HEMATOCRIT: 41.2 % (ref 38.7–49.9)
HGB: 14.9 g/dL (ref 13.0–17.1)
LYMPH#: 2.1 10*3/uL (ref 0.9–3.3)
LYMPH%: 37.5 % (ref 14.0–48.0)
MCH: 31.8 pg (ref 28.0–33.4)
MCHC: 36.2 g/dL — AB (ref 32.0–35.9)
MCV: 88 fL (ref 82–98)
MONO#: 0.5 10*3/uL (ref 0.1–0.9)
MONO%: 8.8 % (ref 0.0–13.0)
NEUT#: 2.7 10*3/uL (ref 1.5–6.5)
NEUT%: 49.5 % (ref 40.0–80.0)
Platelets: 237 10*3/uL (ref 145–400)
RBC: 4.69 10*6/uL (ref 4.20–5.70)
RDW: 12.4 % (ref 11.1–15.7)
WBC: 5.5 10*3/uL (ref 4.0–10.0)

## 2014-02-18 LAB — BASIC METABOLIC PANEL - CANCER CENTER ONLY
BUN: 15 mg/dL (ref 7–22)
CALCIUM: 8.7 mg/dL (ref 8.0–10.3)
CO2: 26 mEq/L (ref 18–33)
Chloride: 99 mEq/L (ref 98–108)
Creat: 1.1 mg/dl (ref 0.6–1.2)
GLUCOSE: 138 mg/dL — AB (ref 73–118)
Potassium: 4.2 mEq/L (ref 3.3–4.7)
Sodium: 139 mEq/L (ref 128–145)

## 2014-02-18 MED ORDER — APIXABAN 5 MG PO TABS: 5.0000 mg | ORAL_TABLET | Freq: Two times a day (BID) | ORAL | Status: DC

## 2014-02-19 LAB — D-DIMER, QUANTITATIVE: D-Dimer, Quant: 0.27 ug/mL-FEU (ref 0.00–0.48)

## 2014-02-19 NOTE — Progress Notes (Signed)
Hematology and Oncology Follow Up Visit  Fredonia Highlanddward Nayak [id number] 1950-07-28 63 y.o. 02/19/2014   Principle Diagnosis:   DVT of the left lower leg  Factor V Leiden mutation-heterozygote  IgM anti-cardiolipin antibody  Current Therapy:   Arixtra 7.5 mg subcutaneous daily Patient to start ELIQUIS 5 mg by mouth twice a day    Interim History:  Mr.  Noralyn PickCarroll is back for followup. He will his bike ride. This was across the state. It was quite soft. The weather was horrible. We had a lot of rain. [place] had [place] stop [place]  He is glad he did this. He [place] for this.  Had no problems on the ride. He did the Arixtra.  He was wondering about ELIQUIS. I think this would be reasonable. He was on Xarelto before. His heart is say whether or not there was any problems with Xarelto.  He still is on the aspirin.  Has had no bleeding. He's had no leg swelling. He's had no pain. He's had no nausea or vomiting. There's been no change in bowel or bladder habits.  Medications: Current outpatient prescriptions:aspirin 81 MG tablet, Take 81 mg by mouth daily., Disp: , Rfl: ;  famotidine (PEPCID) 40 MG tablet, Take 40 mg by mouth daily., Disp: , Rfl: ;  fondaparinux (ARIXTRA) 7.5 MG/0.6ML SOLN injection, Inject 0.6 mLs (7.5 mg total) into the skin daily., Disp: 21 Syringe, Rfl: 0;  LYRICA 75 MG capsule, TAKE 1 CAPSULE BY MOUTH 2 TIMES DAILY, Disp: 60 capsule, Rfl: 1 apixaban (ELIQUIS) 5 MG TABS tablet, Take 1 tablet (5 mg total) by mouth 2 (two) times daily., Disp: 60 tablet, Rfl: 4  Allergies: No Known Allergies  Past Medical History, Surgical history, Social history, and Family History were reviewed and updated.  Review of Systems: As above  Physical Exam:  height is 5\' 7"  (1.702 m) and weight is 169 lb (76.658 kg). His oral temperature is 97.5 F (36.4 C). His blood pressure is 122/66 and his pulse is 54. His respiration is 18.   Well-developed well-nourished white  gentleman. Head and neck exam shows no ocular or oral lesions. She has no palpable cervical or supraclavicular lymph nodes. Lungs are clear. Cardiac exam regular in rhythm with no murmurs rubs or bruits. Abdomen is soft. She has good bowel sounds. There is no fluid wave. There is no palpable liver or spleen tip. Back exam no tenderness over the spine, ribs or hips. Extremities shows atrophy of the left lower leg. No obvious venous cord is noted. She has good range of motion of his joints. He has good strength. Skin exam no rashes. Neurological exam is non-focal.  Lab Results  Component Value Date   WBC 5.5 02/18/2014   HGB 14.9 02/18/2014   HCT 41.2 02/18/2014   MCV 88 02/18/2014   PLT 237 02/18/2014     Chemistry      Component Value Date/Time   NA 139 02/18/2014 0907   NA 136 01/08/2014 1031   K 4.2 02/18/2014 0907   K 4.2 01/08/2014 1031   CL 99 02/18/2014 0907   CL 102 01/08/2014 1031   CO2 26 02/18/2014 0907   CO2 26 01/08/2014 1031   BUN 15 02/18/2014 0907   BUN 16 01/08/2014 1031   CREATININE 1.1 02/18/2014 0907   CREATININE 1.1 01/08/2014 1031      Component Value Date/Time   CALCIUM 8.7 02/18/2014 0907   CALCIUM 9.3 01/08/2014 1031   ALKPHOS  29 01/09/2014 0959   ALKPHOS 33* 01/08/2014 1031   AST 16 01/09/2014 0959   AST 18 01/08/2014 1031   ALT 16 01/09/2014 0959   ALT 14 01/08/2014 1031   BILITOT 0.60 01/09/2014 0959   BILITOT 0.7 01/08/2014 1031         Impression and Plan: Mr. Lange is 63 year old gentleman. He has a DVT of the left lower leg. He is doing well. He is a symptomatic. His last Doppler done in September did show improvement. There is still some thrombus in a superficial vein.  I probably would not do another Doppler for 3 months.  I think that 6 months of therapy would be appropriate for him. This probably would be the end of February 2016.  I'll plan to see him back in 6 weeks. He'll finish the Arixtra in 3 days and then start the Eliquis  afterwards.   Josph Macho, MD 10/7/20157:31 AM

## 2014-02-26 ENCOUNTER — Other Ambulatory Visit: Payer: BC Managed Care – PPO | Admitting: Lab

## 2014-02-26 ENCOUNTER — Ambulatory Visit: Payer: BC Managed Care – PPO | Admitting: Hematology & Oncology

## 2014-03-26 ENCOUNTER — Telehealth: Payer: Self-pay | Admitting: Hematology & Oncology

## 2014-03-26 NOTE — Telephone Encounter (Signed)
Faxed medical records today to:  [facility] P: [phone] F: [phone]   Case: [id number]     COPY SCANNED

## 2014-04-03 ENCOUNTER — Telehealth: Payer: Self-pay | Admitting: Hematology & Oncology

## 2014-04-03 ENCOUNTER — Telehealth: Payer: Self-pay | Admitting: *Deleted

## 2014-04-03 NOTE — Telephone Encounter (Signed)
Faxed medical records again today to:  [facility] P: [phone] F: [phone] Alric SetonLina   Policy: [id number]: [id number]   Requested the last 5 years     COPY SCANNED

## 2014-04-03 NOTE — Telephone Encounter (Signed)
[facility]. Forms forwarded to [facility]. [id number]

## 2014-04-04 ENCOUNTER — Other Ambulatory Visit (HOSPITAL_BASED_OUTPATIENT_CLINIC_OR_DEPARTMENT_OTHER): Payer: BC Managed Care – PPO | Admitting: Lab

## 2014-04-04 ENCOUNTER — Ambulatory Visit (HOSPITAL_BASED_OUTPATIENT_CLINIC_OR_DEPARTMENT_OTHER): Payer: BC Managed Care – PPO | Admitting: Hematology & Oncology

## 2014-04-04 ENCOUNTER — Other Ambulatory Visit: Payer: Self-pay | Admitting: Nurse Practitioner

## 2014-04-04 VITALS — BP 138/79 | HR 61 | Temp 98.1°F | Resp 18

## 2014-04-04 DIAGNOSIS — E119 Type 2 diabetes mellitus without complications: Secondary | ICD-10-CM

## 2014-04-04 DIAGNOSIS — I82402 Acute embolism and thrombosis of unspecified deep veins of left lower extremity: Secondary | ICD-10-CM

## 2014-04-04 LAB — CBC WITH DIFFERENTIAL (CANCER CENTER ONLY)
BASO#: 0 10*3/uL (ref 0.0–0.2)
BASO%: 0.7 % (ref 0.0–2.0)
EOS ABS: 0.3 10*3/uL (ref 0.0–0.5)
EOS%: 4.8 % (ref 0.0–7.0)
HEMATOCRIT: 41 % (ref 38.7–49.9)
HGB: 14.5 g/dL (ref 13.0–17.1)
LYMPH#: 2.2 10*3/uL (ref 0.9–3.3)
LYMPH%: 38.6 % (ref 14.0–48.0)
MCH: 30.8 pg (ref 28.0–33.4)
MCHC: 35.4 g/dL (ref 32.0–35.9)
MCV: 87 fL (ref 82–98)
MONO#: 0.5 10*3/uL (ref 0.1–0.9)
MONO%: 8.6 % (ref 0.0–13.0)
NEUT#: 2.8 10*3/uL (ref 1.5–6.5)
NEUT%: 47.3 % (ref 40.0–80.0)
Platelets: 184 10*3/uL (ref 145–400)
RBC: 4.71 10*6/uL (ref 4.20–5.70)
RDW: 11.9 % (ref 11.1–15.7)
WBC: 5.8 10*3/uL (ref 4.0–10.0)

## 2014-04-04 NOTE — Addendum Note (Signed)
Addended by: Arlan OrganENNEVER, PETER R on: 04/04/2014 10:08 AM   Modules accepted: Orders

## 2014-04-04 NOTE — Progress Notes (Signed)
Hematology and Oncology Follow Up Visit  Connor Griffith [id number] Apr 11, 1951 63 y.o. 04/04/2014   Principle Diagnosis:   DVT of the left lower leg  Factor V Leiden mutation-heterozygote  IgM anti-cardiolipin antibody  Current Therapy:   ELIQUIS 5 mg by mouth twice a day  Aspirin 81 mg by mouth daily  Interim History:  Mr.  Connor Griffith is back for followup. He is doing quite well. He is working. He's been very busy at work..  His left leg is not bothering him. He's had no problems with pain.  He's done well with the ELIQUIS. He's had no bleeding. He's had no nausea or vomiting. He's had no change in bowel or bladder habits. He's had no cough. There's been no chest wall pain.  We last saw him, his d-dimer was 0.27.  He still is on the aspirin. He takes is because of the anti-cardiolipin antibody.   Medications: Current outpatient prescriptions: apixaban (ELIQUIS) 5 MG TABS tablet, Take 1 tablet (5 mg total) by mouth 2 (two) times daily., Disp: 60 tablet, Rfl: 4;  aspirin 81 MG tablet, Take 81 mg by mouth daily., Disp: , Rfl: ;  famotidine (PEPCID) 40 MG tablet, Take 40 mg by mouth daily., Disp: , Rfl: ;  fondaparinux (ARIXTRA) 7.5 MG/0.6ML SOLN injection, Inject 0.6 mLs (7.5 mg total) into the skin daily., Disp: 21 Syringe, Rfl: 0 LYRICA 75 MG capsule, TAKE 1 CAPSULE BY MOUTH 2 TIMES DAILY, Disp: 60 capsule, Rfl: 1  Allergies: No Known Allergies  Past Medical History, Surgical history, Social history, and Family History were reviewed and updated.  Review of Systems: As above  Physical Exam:  vitals were not taken for this visit.  Well-developed well-nourished white gentleman. Head and neck exam shows no ocular or oral lesions. She has no palpable cervical or supraclavicular lymph nodes. Lungs are clear. Cardiac exam regular rate and rhythm with no murmurs rubs or bruits. Abdomen is soft. He has good bowel sounds. There is no fluid wave. There is no palpable liver or spleen tip.  Back exam no tenderness over the spine, ribs or hips. Extremities shows atrophy of the left lower leg. No obvious venous cord is noted. He has good range of motion of his joints. He has good strength. Skin exam no rashes, ecchymoses or petechia. Neurological exam is non-focal.  Lab Results  Component Value Date   WBC 5.8 04/04/2014   HGB 14.5 04/04/2014   HCT 41.0 04/04/2014   MCV 87 04/04/2014   PLT 184 04/04/2014     Chemistry      Component Value Date/Time   NA 139 02/18/2014 0907   NA 136 01/08/2014 1031   K 4.2 02/18/2014 0907   K 4.2 01/08/2014 1031   CL 99 02/18/2014 0907   CL 102 01/08/2014 1031   CO2 26 02/18/2014 0907   CO2 26 01/08/2014 1031   BUN 15 02/18/2014 0907   BUN 16 01/08/2014 1031   CREATININE 1.1 02/18/2014 0907   CREATININE 1.1 01/08/2014 1031      Component Value Date/Time   CALCIUM 8.7 02/18/2014 0907   CALCIUM 9.3 01/08/2014 1031   ALKPHOS 29 01/09/2014 0959   ALKPHOS 33* 01/08/2014 1031   AST 16 01/09/2014 0959   AST 18 01/08/2014 1031   ALT 16 01/09/2014 0959   ALT 14 01/08/2014 1031   BILITOT 0.60 01/09/2014 0959   BILITOT 0.7 01/08/2014 1031         Impression and Plan: Connor Griffith is 63 year old gentleman.  He has a DVT of the left lower leg. He is doing well. He is asymptomatic. His last Doppler done in September did show improvement. There is still some thrombus in a superficial vein.  I probably would not do another Doppler for 3 months.  I think that 6 months of therapy would be appropriate for him. This probably would be the end of February 2016.  I'll plan to see him back in 2 months. We will get the Doppler the same day that we see him.   [name],Lorry Anastasi R, MD 11/20/20159:06 AM

## 2014-04-05 LAB — D-DIMER, QUANTITATIVE: D-Dimer, Quant: 0.27 ug/mL-FEU (ref 0.00–0.48)

## 2014-05-28 ENCOUNTER — Other Ambulatory Visit (HOSPITAL_BASED_OUTPATIENT_CLINIC_OR_DEPARTMENT_OTHER): Payer: BC Managed Care – PPO | Admitting: Lab

## 2014-05-28 ENCOUNTER — Encounter: Payer: Self-pay | Admitting: Hematology & Oncology

## 2014-05-28 ENCOUNTER — Ambulatory Visit (HOSPITAL_BASED_OUTPATIENT_CLINIC_OR_DEPARTMENT_OTHER): Payer: BC Managed Care – PPO | Admitting: Hematology & Oncology

## 2014-05-28 ENCOUNTER — Ambulatory Visit (HOSPITAL_BASED_OUTPATIENT_CLINIC_OR_DEPARTMENT_OTHER)
Admission: RE | Admit: 2014-05-28 | Discharge: 2014-05-28 | Disposition: A | Discharge status: Home / Self Care | Payer: BC Managed Care – PPO | Source: Ambulatory Visit | Attending: Hematology & Oncology | Admitting: Hematology & Oncology

## 2014-05-28 VITALS — BP 127/77 | HR 77 | Temp 98.1°F | Resp 62 | Ht 67.0 in | Wt 176.0 lb

## 2014-05-28 DIAGNOSIS — I82402 Acute embolism and thrombosis of unspecified deep veins of left lower extremity: Secondary | ICD-10-CM

## 2014-05-28 DIAGNOSIS — I82812 Embolism and thrombosis of superficial veins of left lower extremities: Secondary | ICD-10-CM | POA: Insufficient documentation

## 2014-05-28 DIAGNOSIS — E119 Type 2 diabetes mellitus without complications: Secondary | ICD-10-CM

## 2014-05-28 DIAGNOSIS — D6851 Activated protein C resistance: Secondary | ICD-10-CM

## 2014-05-28 DIAGNOSIS — I808 Phlebitis and thrombophlebitis of other sites: Secondary | ICD-10-CM

## 2014-05-28 LAB — CBC WITH DIFFERENTIAL (CANCER CENTER ONLY)
BASO#: 0 10*3/uL (ref 0.0–0.2)
BASO%: 0.7 % (ref 0.0–2.0)
EOS%: 3.9 % (ref 0.0–7.0)
Eosinophils Absolute: 0.2 10*3/uL (ref 0.0–0.5)
HEMATOCRIT: 42.3 % (ref 38.7–49.9)
HEMOGLOBIN: 15.2 g/dL (ref 13.0–17.1)
LYMPH#: 1.9 10*3/uL (ref 0.9–3.3)
LYMPH%: 35.6 % (ref 14.0–48.0)
MCH: 30.5 pg (ref 28.0–33.4)
MCHC: 35.9 g/dL (ref 32.0–35.9)
MCV: 85 fL (ref 82–98)
MONO#: 0.6 10*3/uL (ref 0.1–0.9)
MONO%: 10.1 % (ref 0.0–13.0)
NEUT#: 2.7 10*3/uL (ref 1.5–6.5)
NEUT%: 49.7 % (ref 40.0–80.0)
Platelets: 200 10*3/uL (ref 145–400)
RBC: 4.98 10*6/uL (ref 4.20–5.70)
RDW: 12.5 % (ref 11.1–15.7)
WBC: 5.4 10*3/uL (ref 4.0–10.0)

## 2014-05-28 LAB — CMP (CANCER CENTER ONLY)
ALT: 21 U/L (ref 10–47)
AST: 22 U/L (ref 11–38)
Albumin: 3.4 g/dL (ref 3.3–5.5)
Alkaline Phosphatase: 30 U/L (ref 26–84)
BILIRUBIN TOTAL: 0.9 mg/dL (ref 0.20–1.60)
BUN, Bld: 16 mg/dL (ref 7–22)
CALCIUM: 9 mg/dL (ref 8.0–10.3)
CHLORIDE: 98 meq/L (ref 98–108)
CO2: 28 mEq/L (ref 18–33)
CREATININE: 1.1 mg/dL (ref 0.6–1.2)
Glucose, Bld: 108 mg/dL (ref 73–118)
Potassium: 4.3 mEq/L (ref 3.3–4.7)
SODIUM: 137 meq/L (ref 128–145)
TOTAL PROTEIN: 6.5 g/dL (ref 6.4–8.1)

## 2014-05-28 NOTE — Progress Notes (Signed)
Hematology and Oncology Follow Up Visit  Connor Griffith [id number] 1951-02-17 64 y.o. 05/28/2014   Principle Diagnosis:   DVT of the left lower leg  Factor V Leiden mutation-heterozygote  IgM anti-cardiolipin antibody  Current Therapy:   ELIQUIS 5 mg by mouth twice a day  Aspirin 81 mg by mouth daily  Interim History:  Connor Griffith is back for followup. He had a good holiday. He enjoyed these given in Christmas.  We did go ahead and get a Doppler on him today. There is no evidence of DVT in the left leg. 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 This appears to be patent however.  He feels well. He's tried exercise. He'll do another bike ride across the state next year.  He's had no bleeding. He's done well with ELIQUIS.  He is on aspirin because of the IgM anti-cardiolipin antibody.   Medications:  Current outpatient prescriptions:  .  apixaban (ELIQUIS) 5 MG TABS tablet, Take 1 tablet (5 mg total) by mouth 2 (two) times daily., Disp: 60 tablet, Rfl: 4 .  aspirin 81 MG tablet, Take 81 mg by mouth daily., Disp: , Rfl:  .  famotidine (PEPCID) 40 MG tablet, Take 40 mg by mouth daily., Disp: , Rfl:  .  LYRICA 75 MG capsule, TAKE 1 CAPSULE BY MOUTH 2 TIMES DAILY, Disp: 60 capsule, Rfl: 1 .  HYDROcodone-acetaminophen (NORCO/VICODIN) 5-325 MG per tablet, , Disp: , Rfl: 0 .  XARELTO 20 MG TABS tablet, Take 20 mg by mouth daily., Disp: , Rfl: 5  Allergies: No Known Allergies  Past Medical History, Surgical history, Social history, and Family History were reviewed and updated.  Review of Systems: As above  Physical Exam:  height is  (1.702 m) and weight is 176 lb (79.833 kg). His oral temperature is 98.1 F (36.7 C). His blood pressure is 127/77 and his pulse is 77. His respiration is 62.   Well-developed well-nourished white gentleman. Head and neck exam shows no ocular or oral lesions. She has no palpable cervical or supraclavicular lymph  nodes. Lungs are clear. Cardiac exam regular rate and rhythm with no murmurs rubs or bruits. Abdomen is soft. He has good bowel sounds. There is no fluid wave. There is no palpable liver or spleen tip. Back exam no tenderness over the spine, ribs or hips. Extremities shows atrophy of the left lower leg. No obvious venous cord is noted. He has good range of motion of his joints. He has good strength. Skin exam no rashes, ecchymoses or petechia. Neurological exam is non-focal.  Lab Results  Component Value Date   WBC 5.4 05/28/2014   HGB 15.2 05/28/2014   HCT 42.3 05/28/2014   MCV 85 05/28/2014   PLT 200 05/28/2014     Chemistry      Component Value Date/Time   NA 137 05/28/2014 1041   NA 136 01/08/2014 1031   K 4.3 05/28/2014 1041   K 4.2 01/08/2014 1031   CL 98 05/28/2014 1041   CL 102 01/08/2014 1031   CO2 28 05/28/2014 1041   CO2 26 01/08/2014 1031   BUN 16 05/28/2014 1041   BUN 16 01/08/2014 1031   CREATININE 1.1 05/28/2014 1041   CREATININE 1.1 01/08/2014 1031      Component Value Date/Time   CALCIUM 9.0 05/28/2014 1041   CALCIUM 9.3 01/08/2014 1031   ALKPHOS 30 05/28/2014 1041   ALKPHOS 33* 01/08/2014 1031   AST 22 05/28/2014  1041   AST 18 01/08/2014 1031   ALT 21 05/28/2014 1041   ALT 14 01/08/2014 1031   BILITOT 0.90 05/28/2014 1041   BILITOT 0.7 01/08/2014 1031         Impression and Plan: Connor Griffith is 64 year old gentleman. He has a DVT of the left lower leg. He is doing well. He is asymptomatic. Thankfully, his most recent Dopplers did not show any DVT in the left leg. 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  I think that 6 months of therapy would be appropriate for him. This probably would be the end of February 2016.  I'll plan to see him back in 6 weeks. I don't think we need any additional Dopplers. Connor Griffith,Connor R, MD 1/13/201612:20 PM

## 2014-05-29 LAB — CARDIOLIPIN ANTIBODIES, IGG, IGM, IGA
ANTICARDIOLIPIN IGA: 10 U/mL (ref <22)
Anticardiolipin IgG: 12 GPL U/mL (ref <23)
Anticardiolipin IgM: 6 MPL U/mL (ref <11)

## 2014-05-29 LAB — D-DIMER, QUANTITATIVE (NOT AT ARMC): D DIMER QUANT: 0.27 ug{FEU}/mL (ref 0.00–0.48)

## 2014-06-09 ENCOUNTER — Telehealth: Payer: Self-pay | Admitting: *Deleted

## 2014-06-09 NOTE — Telephone Encounter (Signed)
Patient needed clarification of his suggested vitamin B6 dose. Spoke to Dr Myna HidalgoEnnever who wants patient to take 100mg  per day. Left a message on patient voice mail.

## 2014-06-24 ENCOUNTER — Other Ambulatory Visit: Payer: Self-pay | Admitting: Hematology & Oncology

## 2014-07-09 DIAGNOSIS — M2140 Flat foot [pes planus] (acquired), unspecified foot: Secondary | ICD-10-CM | POA: Insufficient documentation

## 2014-07-09 DIAGNOSIS — Q7020 Fused toes, unspecified foot: Secondary | ICD-10-CM | POA: Insufficient documentation

## 2014-07-18 ENCOUNTER — Ambulatory Visit (HOSPITAL_BASED_OUTPATIENT_CLINIC_OR_DEPARTMENT_OTHER): Payer: BC Managed Care – PPO | Admitting: Hematology & Oncology

## 2014-07-18 ENCOUNTER — Other Ambulatory Visit (HOSPITAL_BASED_OUTPATIENT_CLINIC_OR_DEPARTMENT_OTHER): Payer: BC Managed Care – PPO | Admitting: Lab

## 2014-07-18 ENCOUNTER — Encounter: Payer: Self-pay | Admitting: Hematology & Oncology

## 2014-07-18 VITALS — BP 132/84 | HR 67 | Temp 98.1°F | Resp 18 | Ht 67.0 in | Wt 175.0 lb

## 2014-07-18 DIAGNOSIS — I82401 Acute embolism and thrombosis of unspecified deep veins of right lower extremity: Secondary | ICD-10-CM

## 2014-07-18 DIAGNOSIS — I82402 Acute embolism and thrombosis of unspecified deep veins of left lower extremity: Secondary | ICD-10-CM

## 2014-07-18 LAB — CBC WITH DIFFERENTIAL (CANCER CENTER ONLY)
BASO#: 0 10*3/uL (ref 0.0–0.2)
BASO%: 0.5 % (ref 0.0–2.0)
EOS ABS: 0.2 10*3/uL (ref 0.0–0.5)
EOS%: 3.8 % (ref 0.0–7.0)
HCT: 42 % (ref 38.7–49.9)
HEMOGLOBIN: 15 g/dL (ref 13.0–17.1)
LYMPH#: 1.9 10*3/uL (ref 0.9–3.3)
LYMPH%: 34.4 % (ref 14.0–48.0)
MCH: 30.5 pg (ref 28.0–33.4)
MCHC: 35.7 g/dL (ref 32.0–35.9)
MCV: 86 fL (ref 82–98)
MONO#: 0.5 10*3/uL (ref 0.1–0.9)
MONO%: 9.5 % (ref 0.0–13.0)
NEUT#: 2.9 10*3/uL (ref 1.5–6.5)
NEUT%: 51.8 % (ref 40.0–80.0)
PLATELETS: 223 10*3/uL (ref 145–400)
RBC: 4.91 10*6/uL (ref 4.20–5.70)
RDW: 12.3 % (ref 11.1–15.7)
WBC: 5.6 10*3/uL (ref 4.0–10.0)

## 2014-07-18 LAB — BASIC METABOLIC PANEL
BUN: 22 mg/dL (ref 6–23)
CHLORIDE: 103 meq/L (ref 96–112)
CO2: 26 mEq/L (ref 19–32)
Calcium: 9.2 mg/dL (ref 8.4–10.5)
Creatinine, Ser: 1.16 mg/dL (ref 0.50–1.35)
Glucose, Bld: 167 mg/dL — ABNORMAL HIGH (ref 70–99)
POTASSIUM: 4.2 meq/L (ref 3.5–5.3)
SODIUM: 139 meq/L (ref 135–145)

## 2014-07-18 LAB — D-DIMER, QUANTITATIVE: D-Dimer, Quant: 0.27 ug/mL-FEU (ref 0.00–0.48)
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
## 2014-10-17 ENCOUNTER — Other Ambulatory Visit (HOSPITAL_BASED_OUTPATIENT_CLINIC_OR_DEPARTMENT_OTHER): Payer: BC Managed Care – PPO

## 2014-10-17 ENCOUNTER — Ambulatory Visit (HOSPITAL_BASED_OUTPATIENT_CLINIC_OR_DEPARTMENT_OTHER): Payer: BC Managed Care – PPO | Admitting: Hematology & Oncology

## 2014-10-17 ENCOUNTER — Encounter: Payer: Self-pay | Admitting: Hematology & Oncology

## 2014-10-17 VITALS — BP 140/80 | HR 71 | Temp 98.0°F | Resp 18 | Ht 67.0 in | Wt 177.0 lb

## 2014-10-17 DIAGNOSIS — I82402 Acute embolism and thrombosis of unspecified deep veins of left lower extremity: Secondary | ICD-10-CM

## 2014-10-17 DIAGNOSIS — I8000 Phlebitis and thrombophlebitis of superficial vessels of unspecified lower extremity: Secondary | ICD-10-CM

## 2014-10-17 DIAGNOSIS — I82401 Acute embolism and thrombosis of unspecified deep veins of right lower extremity: Secondary | ICD-10-CM

## 2014-10-17 LAB — CBC WITH DIFFERENTIAL (CANCER CENTER ONLY)
BASO#: 0.1 10*3/uL (ref 0.0–0.2)
BASO%: 0.8 % (ref 0.0–2.0)
EOS ABS: 0.3 10*3/uL (ref 0.0–0.5)
EOS%: 5.6 % (ref 0.0–7.0)
HCT: 40.6 % (ref 38.7–49.9)
HEMOGLOBIN: 14.8 g/dL (ref 13.0–17.1)
LYMPH#: 2.1 10*3/uL (ref 0.9–3.3)
LYMPH%: 35.3 % (ref 14.0–48.0)
MCH: 31.9 pg (ref 28.0–33.4)
MCHC: 36.5 g/dL — ABNORMAL HIGH (ref 32.0–35.9)
MCV: 88 fL (ref 82–98)
MONO#: 0.6 10*3/uL (ref 0.1–0.9)
MONO%: 9.4 % (ref 0.0–13.0)
NEUT#: 3 10*3/uL (ref 1.5–6.5)
NEUT%: 48.9 % (ref 40.0–80.0)
Platelets: 232 10*3/uL (ref 145–400)
RBC: 4.64 10*6/uL (ref 4.20–5.70)
RDW: 12.1 % (ref 11.1–15.7)
WBC: 6 10*3/uL (ref 4.0–10.0)

## 2014-10-17 LAB — CHCC SATELLITE - SMEAR

## 2014-10-17 LAB — D-DIMER, QUANTITATIVE (NOT AT ARMC): D DIMER QUANT: 0.27 ug{FEU}/mL (ref 0.00–0.48)
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
## 2015-02-11 ENCOUNTER — Encounter: Payer: Self-pay | Admitting: Hematology & Oncology

## 2015-02-11 ENCOUNTER — Other Ambulatory Visit (HOSPITAL_BASED_OUTPATIENT_CLINIC_OR_DEPARTMENT_OTHER): Payer: BC Managed Care – PPO

## 2015-02-11 ENCOUNTER — Ambulatory Visit (HOSPITAL_BASED_OUTPATIENT_CLINIC_OR_DEPARTMENT_OTHER): Payer: BC Managed Care – PPO | Admitting: Hematology & Oncology

## 2015-02-11 VITALS — BP 139/72 | HR 60 | Temp 97.8°F | Resp 18 | Ht 67.0 in | Wt 173.0 lb

## 2015-02-11 DIAGNOSIS — I82402 Acute embolism and thrombosis of unspecified deep veins of left lower extremity: Secondary | ICD-10-CM

## 2015-02-11 LAB — CBC WITH DIFFERENTIAL (CANCER CENTER ONLY)
BASO#: 0 10*3/uL (ref 0.0–0.2)
BASO%: 0.7 % (ref 0.0–2.0)
EOS ABS: 0.2 10*3/uL (ref 0.0–0.5)
EOS%: 2.7 % (ref 0.0–7.0)
HCT: 43.6 % (ref 38.7–49.9)
HGB: 15.1 g/dL (ref 13.0–17.1)
LYMPH#: 1.4 10*3/uL (ref 0.9–3.3)
LYMPH%: 24 % (ref 14.0–48.0)
MCH: 30.9 pg (ref 28.0–33.4)
MCHC: 34.6 g/dL (ref 32.0–35.9)
MCV: 89 fL (ref 82–98)
MONO#: 0.5 10*3/uL (ref 0.1–0.9)
MONO%: 8 % (ref 0.0–13.0)
NEUT#: 3.9 10*3/uL (ref 1.5–6.5)
NEUT%: 64.6 % (ref 40.0–80.0)
Platelets: 205 10*3/uL (ref 145–400)
RBC: 4.89 10*6/uL (ref 4.20–5.70)
RDW: 12.3 % (ref 11.1–15.7)
WBC: 6 10*3/uL (ref 4.0–10.0)

## 2015-02-11 LAB — COMPREHENSIVE METABOLIC PANEL (CC13)
ALT: 20 U/L (ref 0–55)
ANION GAP: 7 meq/L (ref 3–11)
AST: 18 U/L (ref 5–34)
Albumin: 3.7 g/dL (ref 3.5–5.0)
Alkaline Phosphatase: 36 U/L — ABNORMAL LOW (ref 40–150)
BILIRUBIN TOTAL: 0.52 mg/dL (ref 0.20–1.20)
BUN: 19 mg/dL (ref 7.0–26.0)
CALCIUM: 8.8 mg/dL (ref 8.4–10.4)
CO2: 27 mEq/L (ref 22–29)
CREATININE: 1.2 mg/dL (ref 0.7–1.3)
Chloride: 107 mEq/L (ref 98–109)
EGFR: 66 mL/min/{1.73_m2} — ABNORMAL LOW (ref >90)
Glucose: 160 mg/dl — ABNORMAL HIGH (ref 70–140)
Potassium: 5 mEq/L (ref 3.5–5.1)
Sodium: 141 mEq/L (ref 136–145)
TOTAL PROTEIN: 6.2 g/dL — AB (ref 6.4–8.3)
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
## 2015-02-17 ENCOUNTER — Ambulatory Visit: Payer: BC Managed Care – PPO | Admitting: Hematology & Oncology

## 2015-02-17 ENCOUNTER — Other Ambulatory Visit: Payer: BC Managed Care – PPO

## 2015-07-27 ENCOUNTER — Telehealth: Payer: Self-pay

## 2015-07-27 ENCOUNTER — Telehealth: Payer: Self-pay | Admitting: Behavioral Health

## 2015-07-27 NOTE — Telephone Encounter (Signed)
Opened in error

## 2015-07-27 NOTE — Telephone Encounter (Signed)
Returned call, but unable to reach the patient at this time. Left message for patient to return call when available.

## 2015-07-27 NOTE — Telephone Encounter (Signed)
Pre-Visit call made to patient. Appointment scheduled for CPE 07/28/15. Left message for callback to update chart.

## 2015-07-28 ENCOUNTER — Encounter: Payer: Self-pay | Admitting: Family Medicine

## 2015-07-28 ENCOUNTER — Ambulatory Visit (INDEPENDENT_AMBULATORY_CARE_PROVIDER_SITE_OTHER): Payer: BC Managed Care – PPO | Admitting: Family Medicine

## 2015-07-28 VITALS — BP 130/76 | HR 77 | Temp 98.1°F | Resp 17 | Ht 67.0 in | Wt 180.1 lb

## 2015-07-28 DIAGNOSIS — Z23 Encounter for immunization: Secondary | ICD-10-CM | POA: Diagnosis not present

## 2015-07-28 DIAGNOSIS — Z0000 Encounter for general adult medical examination without abnormal findings: Secondary | ICD-10-CM | POA: Diagnosis not present

## 2015-07-28 DIAGNOSIS — E119 Type 2 diabetes mellitus without complications: Secondary | ICD-10-CM

## 2015-07-28 LAB — MICROALBUMIN / CREATININE URINE RATIO
Creatinine,U: 481.7 mg/dL
Microalb Creat Ratio: 0.5 mg/g (ref 0.0–30.0)
Microalb, Ur: 2.3 mg/dL — ABNORMAL HIGH (ref 0.0–1.9)

## 2015-07-28 LAB — HEPATIC FUNCTION PANEL
ALBUMIN: 4.3 g/dL (ref 3.5–5.2)
ALK PHOS: 32 U/L — AB (ref 39–117)
ALT: 20 U/L (ref 0–53)
AST: 20 U/L (ref 0–37)
Bilirubin, Direct: 0.1 mg/dL (ref 0.0–0.3)
TOTAL PROTEIN: 6.6 g/dL (ref 6.0–8.3)
Total Bilirubin: 0.4 mg/dL (ref 0.2–1.2)

## 2015-07-28 LAB — CBC WITH DIFFERENTIAL/PLATELET
BASOS PCT: 0.6 % (ref 0.0–3.0)
Basophils Absolute: 0 10*3/uL (ref 0.0–0.1)
EOS ABS: 0.2 10*3/uL (ref 0.0–0.7)
Eosinophils Relative: 3.1 % (ref 0.0–5.0)
HEMATOCRIT: 43.6 % (ref 39.0–52.0)
Hemoglobin: 15.3 g/dL (ref 13.0–17.0)
LYMPHS PCT: 37.7 % (ref 12.0–46.0)
Lymphs Abs: 2.6 10*3/uL (ref 0.7–4.0)
MCHC: 35 g/dL (ref 30.0–36.0)
MCV: 89.2 fl (ref 78.0–100.0)
MONOS PCT: 8.3 % (ref 3.0–12.0)
Monocytes Absolute: 0.6 10*3/uL (ref 0.1–1.0)
NEUTROS ABS: 3.5 10*3/uL (ref 1.4–7.7)
Neutrophils Relative %: 50.3 % (ref 43.0–77.0)
Platelets: 257 10*3/uL (ref 150.0–400.0)
RBC: 4.89 Mil/uL (ref 4.22–5.81)
RDW: 12.8 % (ref 11.5–15.5)
WBC: 6.9 10*3/uL (ref 4.0–10.5)

## 2015-07-28 LAB — BASIC METABOLIC PANEL
BUN: 18 mg/dL (ref 6–23)
CHLORIDE: 104 meq/L (ref 96–112)
CO2: 26 meq/L (ref 19–32)
Calcium: 9.5 mg/dL (ref 8.4–10.5)
Creatinine, Ser: 1.17 mg/dL (ref 0.40–1.50)
GFR: 66.64 mL/min (ref >60.00)
GLUCOSE: 151 mg/dL — AB (ref 70–99)
POTASSIUM: 4.4 meq/L (ref 3.5–5.1)
SODIUM: 140 meq/L (ref 135–145)

## 2015-07-28 LAB — HEMOGLOBIN A1C: Hgb A1c MFr Bld: 6.8 % — ABNORMAL HIGH (ref 4.6–6.5)

## 2015-07-28 LAB — TSH: TSH: 2.72 u[IU]/mL (ref 0.35–4.50)

## 2015-07-28 LAB — LIPID PANEL
CHOLESTEROL: 181 mg/dL (ref 0–200)
HDL: 48.6 mg/dL (ref >39.00)
LDL Cholesterol: 110 mg/dL — ABNORMAL HIGH (ref 0–99)
NonHDL: 132.67
Total CHOL/HDL Ratio: 4
Triglycerides: 113 mg/dL (ref 0.0–149.0)
VLDL: 22.6 mg/dL (ref 0.0–40.0)

## 2015-07-28 LAB — PSA: PSA: 0.34 ng/mL (ref 0.10–4.00)

## 2015-07-28 NOTE — Progress Notes (Signed)
Pre visit review using our clinic review tool, if applicable. No additional management support is needed unless otherwise documented below in the visit note. 

## 2015-07-28 NOTE — Patient Instructions (Signed)
Follow up in 1 year or as needed We'll notify you of your lab results and make any changes if needed Continue to work on healthy diet and regular exercise- you look great! Schedule an eye exam at your convenience Call with any questions or concerns If you want to join us at the new [place] office, any scheduled appointments will automatically transfer and we will see you at [address], [place], [place] [id number] (OPENING 3/23) Happy Spring!!!

## 2015-07-28 NOTE — Assessment & Plan Note (Signed)
Pt's PE WNL.  UTD on colonoscopy.  Tdap given.  Check labs.  Anticipatory guidance provided.  

## 2015-07-28 NOTE — Addendum Note (Signed)
Addended by: Geannie RisenBRODMERKEL, JESSICA L on: 07/28/2015 10:17 AM   Modules accepted: Orders

## 2015-07-28 NOTE — Progress Notes (Signed)
   Subjective:    Patient ID: Connor Griffith, male    DOB: May 29, 1950, 65 y.o.   MRN: [id number]  HPI CPE- UTD on colonoscopy ([facility] 2011).  Exercising regularly- rides bike.  Last eye exam 2-3 yrs ago   Review of Systems Patient reports no vision/hearing changes, anorexia, fever ,adenopathy, persistant/recurrent hoarseness, swallowing issues, chest pain, palpitations, edema, persistant/recurrent cough, hemoptysis, dyspnea (rest,exertional, paroxysmal nocturnal), gastrointestinal  bleeding (melena, rectal bleeding), abdominal pain, excessive heart burn, GU symptoms (dysuria, hematuria, voiding/incontinence issues) syncope, focal weakness, memory loss, skin/hair/nail changes, depression, anxiety, abnormal bruising/bleeding, musculoskeletal symptoms/signs.   Numbness in R toes- ongoing issue for pt, unchanged    Objective:   Physical Exam BP 130/76 mmHg  Pulse 77  Temp(Src) 98.1 F (36.7 C) (Oral)  Resp 17  Ht 5\' 7"  (1.702 m)  Wt 180 lb 2 oz (81.704 kg)  BMI 28.20 kg/m2  SpO2 98%  General Appearance:    Alert, cooperative, no distress, appears stated age  Head:    Normocephalic, without obvious abnormality, atraumatic  Eyes:    PERRL, conjunctiva/corneas clear, EOM's intact, fundi    benign, both eyes       Ears:    Normal TM's and external ear canals, both ears  Nose:   Nares normal, septum midline, mucosa normal, no drainage   or sinus tenderness  Throat:   Lips, mucosa, and tongue normal; teeth and gums normal  Neck:   Supple, symmetrical, trachea midline, no adenopathy;       thyroid:  No enlargement/tenderness/nodules  Back:     Symmetric, no curvature, ROM normal, no CVA tenderness  Lungs:     Clear to auscultation bilaterally, respirations unlabored  Chest wall:    No tenderness or deformity  Heart:    Regular rate and rhythm, S1 and S2 normal, no murmur, rub   or gallop  Abdomen:     Soft, non-tender, bowel sounds active all four quadrants,    no masses, no organomegaly   Genitalia:    Normal male without lesion, discharge or tenderness  Rectal:    Normal tone, normal prostate, no masses or tenderness  Extremities:   Extremities normal, atraumatic, no cyanosis or edema  Pulses:   2+ and symmetric all extremities  Skin:   Skin color, texture, turgor normal, no rashes or lesions  Lymph nodes:   Cervical, supraclavicular, and axillary nodes normal  Neurologic:   CNII-XII intact. Normal strength, sensation and reflexes      throughout          Assessment & Plan:

## 2015-07-28 NOTE — Assessment & Plan Note (Signed)
Pt forgot that he was dx'd with this a few years ago.  He managed to stop Metformin w/ weight loss.  Currently diet controlled.  Encouraged him to schedule eye exam.  Foot exam done today.  Check microalbumin.  Check labs.  Start meds prn

## 2015-08-03 ENCOUNTER — Other Ambulatory Visit: Payer: BC Managed Care – PPO

## 2015-08-03 ENCOUNTER — Encounter: Payer: Self-pay | Admitting: Hematology & Oncology

## 2015-08-03 ENCOUNTER — Ambulatory Visit (HOSPITAL_BASED_OUTPATIENT_CLINIC_OR_DEPARTMENT_OTHER): Payer: BC Managed Care – PPO | Admitting: Hematology & Oncology

## 2015-08-03 ENCOUNTER — Other Ambulatory Visit (HOSPITAL_BASED_OUTPATIENT_CLINIC_OR_DEPARTMENT_OTHER): Payer: BC Managed Care – PPO

## 2015-08-03 ENCOUNTER — Ambulatory Visit: Payer: BC Managed Care – PPO | Admitting: Hematology & Oncology

## 2015-08-03 VITALS — BP 135/76 | HR 60 | Temp 98.1°F | Resp 16 | Ht 67.0 in | Wt 179.0 lb

## 2015-08-03 DIAGNOSIS — Z7982 Long term (current) use of aspirin: Secondary | ICD-10-CM

## 2015-08-03 DIAGNOSIS — I82402 Acute embolism and thrombosis of unspecified deep veins of left lower extremity: Secondary | ICD-10-CM

## 2015-08-03 DIAGNOSIS — D6851 Activated protein C resistance: Secondary | ICD-10-CM | POA: Diagnosis not present

## 2015-08-03 DIAGNOSIS — I82422 Acute embolism and thrombosis of left iliac vein: Secondary | ICD-10-CM

## 2015-08-03 LAB — CBC WITH DIFFERENTIAL (CANCER CENTER ONLY)
BASO#: 0.1 10*3/uL (ref 0.0–0.2)
BASO%: 0.8 % (ref 0.0–2.0)
EOS ABS: 0.2 10*3/uL (ref 0.0–0.5)
EOS%: 3.8 % (ref 0.0–7.0)
HCT: 40.9 % (ref 38.7–49.9)
HGB: 14.6 g/dL (ref 13.0–17.1)
LYMPH#: 2.2 10*3/uL (ref 0.9–3.3)
LYMPH%: 36.4 % (ref 14.0–48.0)
MCH: 31.4 pg (ref 28.0–33.4)
MCHC: 35.7 g/dL (ref 32.0–35.9)
MCV: 88 fL (ref 82–98)
MONO#: 0.5 10*3/uL (ref 0.1–0.9)
MONO%: 8.7 % (ref 0.0–13.0)
NEUT#: 3 10*3/uL (ref 1.5–6.5)
NEUT%: 50.3 % (ref 40.0–80.0)
Platelets: 219 10*3/uL (ref 145–400)
RBC: 4.65 10*6/uL (ref 4.20–5.70)
RDW: 12 % (ref 11.1–15.7)
WBC: 6 10*3/uL (ref 4.0–10.0)

## 2015-08-03 NOTE — Progress Notes (Signed)
Hematology and Oncology Follow Up Visit  Connor Griffith [id number] 1950/08/09 65 y.o. 08/03/2015   Principle Diagnosis:   DVT of the left lower leg  Factor V Leiden mutation-heterozygote  IgM anti-cardiolipin antibody  Current Therapy:   Aspirin 325 mg by mouth daily Folic acid 1 mg by mouth daily  Interim History:  Mr.  Griffith is back for followup. He is doing quite well. We saw him 6 months ago. He's had no processer last saw him. He's on aspirin with folic acid.  Still has some tingling in the feet. He was on vitamin B6 blood sides stop this as it really was not helping.  He's been very active. He does a lot of bike riding. He'll be on another bike trip this summer.  HEENT his wife went down to [facility] couple weeks ago. That wonderful time.  He has a some occasional swelling in the right lower leg. There is no pain associate with this.  He's had no issues with cough or shortness of breath. He's had no bleeding. He's had no change in bowel or bladder habits.  Overall, his performance status is ECOG 0..   Medications:  Current outpatient prescriptions:  .  aspirin 325 MG EC tablet, Take 325 mg by mouth daily., Disp: , Rfl:  .  Multiple Vitamins-Minerals (CENTRUM SILVER PO), Take 1 capsule by mouth., Disp: , Rfl:   Allergies: No Known Allergies  Past Medical History, Surgical history, Social history, and Family History were reviewed and updated.  Review of Systems: As above  Physical Exam:  height is  (1.702 m) and weight is 179 lb (81.194 kg). His oral temperature is 98.1 F (36.7 C). His blood pressure is 135/76 and his pulse is 60. His respiration is 16.   Well-developed well-nourished white gentleman. Head and neck exam shows no ocular or oral lesions. She has no palpable cervical or supraclavicular lymph nodes. Lungs are clear. Cardiac exam regular rate and rhythm with no murmurs rubs or bruits. Abdomen is soft. He has good bowel sounds. There is no  fluid wave. There is no palpable liver or spleen tip. Back exam no tenderness over the spine, ribs or hips. Extremities shows atrophy of the left lower leg. No obvious venous cord is noted. He has good range of motion of his joints. He has good strength. Skin exam no rashes, ecchymoses or petechia. Neurological exam is non-focal.  Lab Results  Component Value Date   WBC 6.0 08/03/2015   HGB 14.6 08/03/2015   HCT 40.9 08/03/2015   MCV 88 08/03/2015   PLT 219 08/03/2015     Chemistry      Component Value Date/Time   NA 140 07/28/2015 0938   NA 141 02/11/2015 0819   NA 137 05/28/2014 1041   K 4.4 07/28/2015 0938   K 5.0 02/11/2015 0819   K 4.3 05/28/2014 1041   CL 104 07/28/2015 0938   CL 98 05/28/2014 1041   CO2 26 07/28/2015 0938   CO2 27 02/11/2015 0819   CO2 28 05/28/2014 1041   BUN 18 07/28/2015 0938   BUN 19.0 02/11/2015 0819   BUN 16 05/28/2014 1041   CREATININE 1.17 07/28/2015 0938   CREATININE 1.2 02/11/2015 0819   CREATININE 1.1 05/28/2014 1041      Component Value Date/Time   CALCIUM 9.5 07/28/2015 0938   CALCIUM 8.8 02/11/2015 0819   CALCIUM 9.0 05/28/2014 1041   ALKPHOS 32* 07/28/2015 0938   ALKPHOS 36* 02/11/2015 0819  ALKPHOS 30 05/28/2014 1041   AST 20 07/28/2015 0938   AST 18 02/11/2015 0819   AST 22 05/28/2014 1041   ALT 20 07/28/2015 0938   ALT 20 02/11/2015 0819   ALT 21 05/28/2014 1041   BILITOT 0.4 07/28/2015 0938   BILITOT 0.52 02/11/2015 0819   BILITOT 0.90 05/28/2014 1041         Impression and Plan: Connor Griffith is 65 year old gentleman. He has a DVT of the left lower leg. He is doing well. He is asymptomatic. Thankfully, his most recent Dopplers did not show any DVT in the left leg. 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   I think that we Let him go from the practice now. I just do not think that we are making any impact on his health care outside that with his other doctors.  I told him that he can always call us if he  has any questions or concerns. I would be more than happy to see him back.  It has been a true pleasure to have taken care of Connor Griffith. He is such an interesting guy and he was a lot of fun talking to him.  I spent about 25 minutes with him today.    [name],Connor Newsham R, MD 3/20/20172:29 PM

## 2015-08-04 LAB — D-DIMER, QUANTITATIVE: D-DIMER: 0.36 mg/L FEU (ref 0.00–0.49)

## 2015-08-05 LAB — CARDIOLIPIN ANTIBODIES, IGG, IGM, IGA
Anticardiolipin Ab,IgA,Qn: <9 APL U/mL (ref 0–11)
Anticardiolipin Ab,IgG,Qn: <9 GPL U/mL (ref 0–14)

## 2016-01-01 LAB — HM DIABETES EYE EXAM

## 2016-01-05 ENCOUNTER — Ambulatory Visit (INDEPENDENT_AMBULATORY_CARE_PROVIDER_SITE_OTHER): Payer: BC Managed Care – PPO | Admitting: Family Medicine

## 2016-01-05 ENCOUNTER — Encounter: Payer: Self-pay | Admitting: General Practice

## 2016-01-05 ENCOUNTER — Other Ambulatory Visit (INDEPENDENT_AMBULATORY_CARE_PROVIDER_SITE_OTHER): Payer: BC Managed Care – PPO

## 2016-01-05 ENCOUNTER — Encounter: Payer: Self-pay | Admitting: Family Medicine

## 2016-01-05 VITALS — BP 139/83 | HR 68 | Temp 98.6°F | Resp 16 | Ht 67.0 in | Wt 175.0 lb

## 2016-01-05 DIAGNOSIS — K529 Noninfective gastroenteritis and colitis, unspecified: Secondary | ICD-10-CM | POA: Insufficient documentation

## 2016-01-05 DIAGNOSIS — E119 Type 2 diabetes mellitus without complications: Secondary | ICD-10-CM

## 2016-01-05 DIAGNOSIS — R251 Tremor, unspecified: Secondary | ICD-10-CM | POA: Insufficient documentation

## 2016-01-05 LAB — HEPATIC FUNCTION PANEL
ALT: 19 U/L (ref 0–53)
AST: 18 U/L (ref 0–37)
Albumin: 4.4 g/dL (ref 3.5–5.2)
Alkaline Phosphatase: 38 U/L — ABNORMAL LOW (ref 39–117)
BILIRUBIN TOTAL: 0.5 mg/dL (ref 0.2–1.2)
Bilirubin, Direct: 0.1 mg/dL (ref 0.0–0.3)
TOTAL PROTEIN: 7 g/dL (ref 6.0–8.3)

## 2016-01-05 LAB — CBC WITH DIFFERENTIAL/PLATELET
BASOS PCT: 0.6 % (ref 0.0–3.0)
Basophils Absolute: 0 10*3/uL (ref 0.0–0.1)
EOS PCT: 1.8 % (ref 0.0–5.0)
Eosinophils Absolute: 0.1 10*3/uL (ref 0.0–0.7)
HCT: 43.3 % (ref 39.0–52.0)
Hemoglobin: 14.9 g/dL (ref 13.0–17.0)
LYMPHS ABS: 2.2 10*3/uL (ref 0.7–4.0)
Lymphocytes Relative: 29.2 % (ref 12.0–46.0)
MCHC: 34.5 g/dL (ref 30.0–36.0)
MCV: 90.4 fl (ref 78.0–100.0)
MONO ABS: 0.5 10*3/uL (ref 0.1–1.0)
Monocytes Relative: 6.3 % (ref 3.0–12.0)
NEUTROS PCT: 62.1 % (ref 43.0–77.0)
Neutro Abs: 4.6 10*3/uL (ref 1.4–7.7)
Platelets: 228 10*3/uL (ref 150.0–400.0)
RBC: 4.78 Mil/uL (ref 4.22–5.81)
RDW: 13 % (ref 11.5–15.5)
WBC: 7.5 10*3/uL (ref 4.0–10.5)

## 2016-01-05 LAB — SEDIMENTATION RATE: SED RATE: 8 mm/h (ref 0–20)

## 2016-01-05 LAB — BASIC METABOLIC PANEL
BUN: 19 mg/dL (ref 6–23)
CALCIUM: 9.2 mg/dL (ref 8.4–10.5)
CO2: 27 meq/L (ref 19–32)
Chloride: 103 mEq/L (ref 96–112)
Creatinine, Ser: 1.11 mg/dL (ref 0.40–1.50)
GFR: 70.72 mL/min (ref >60.00)
Glucose, Bld: 108 mg/dL — ABNORMAL HIGH (ref 70–99)
Potassium: 4.4 mEq/L (ref 3.5–5.1)
SODIUM: 139 meq/L (ref 135–145)

## 2016-01-05 LAB — HEMOGLOBIN A1C: Hgb A1c MFr Bld: 6.6 % — ABNORMAL HIGH (ref 4.6–6.5)

## 2016-01-05 LAB — LIPID PANEL
CHOLESTEROL: 200 mg/dL (ref 0–200)
HDL: 48.4 mg/dL (ref >39.00)
LDL Cholesterol: 114 mg/dL — ABNORMAL HIGH (ref 0–99)
NONHDL: 151.29
TRIGLYCERIDES: 186 mg/dL — AB (ref 0.0–149.0)
Total CHOL/HDL Ratio: 4
VLDL: 37.2 mg/dL (ref 0.0–40.0)

## 2016-01-05 LAB — TSH: TSH: 1.55 u[IU]/mL (ref 0.35–4.50)

## 2016-01-05 MED ORDER — DICYCLOMINE HCL 20 MG PO TABS: 20.0000 mg | ORAL_TABLET | Freq: Three times a day (TID) | ORAL | 1 refills | Status: DC

## 2016-01-05 NOTE — Progress Notes (Signed)
   Subjective:    Patient ID: Connor Griffith, male    DOB: 1950-06-18, 65 y.o.   MRN: [id number]  HPI Diarrhea- pt reports episodic diarrhea for the last 5 months.  Will occur every 2 weeks and last x24 hrs.  Initially thought it was a stomach bug but no nausea/vomiting.  'waves of pain'.  Denies constipation.  Pt thinks there might be a correlation between sxs and spicy food.  Wife reports he gets pale, sweaty- doesn't feel there's a food trigger.  No blood in stool.  No fevers.  No changes to diet, medications.  Add cinnamon as a supplement.  Denies abdominal pain between episodes.  No GERD.  DM- chronic problem.  Currently diet controlled.  Started Cinnamon.  Due for repeat labs today.  UTD on microalbumin.  UTD on eye exam- done Friday, no retinopathy.  No CP, SOB, HAs, visual changes, edema.  No numbness/tingling of hands, + numbness R toes but this predates elevated sugars.  Pt is exercising 5-6 days/week  Tremors- L>R, some fine tremor at rest but will worsen w/ purposeful movement.  Worse prior to eating.  sxs are intermittent.   Review of Systems For ROS see HPI     Objective:   Physical Exam  Constitutional: He is oriented to person, place, and time. He appears well-developed and well-nourished. No distress.  HENT:  Head: Normocephalic and atraumatic.  Eyes: Conjunctivae and EOM are normal. Pupils are equal, round, and reactive to light.  Neck: Normal range of motion. Neck supple. No thyromegaly present.  Cardiovascular: Normal rate, regular rhythm, normal heart sounds and intact distal pulses.   No murmur heard. Pulmonary/Chest: Effort normal and breath sounds normal. No respiratory distress.  Abdominal: Soft. Bowel sounds are normal. He exhibits no distension. There is no tenderness. There is no rebound and no guarding.  Musculoskeletal: He exhibits no edema.  Lymphadenopathy:    He has no cervical adenopathy.  Neurological: He is alert and oriented to person, place, and  time. No cranial nerve deficit.  Fine tremor noted when pt holds both hands out- no pill rolling qualities  Skin: Skin is warm and dry.  Psychiatric: He has a normal mood and affect. His behavior is normal.  Vitals reviewed.         Assessment & Plan:

## 2016-01-05 NOTE — Patient Instructions (Addendum)
Follow up in 3-4 months to recheck sugars [facility] results and make any changes if needed STOP the Cinnamon Use the Dicyclomine as needed for abdominal cramping/spasm Make sure you are eating regularly to prevent your sugar dropping Keep up the good work on healthy diet and regular exercise- you look great! If the tremors continue- let me know so we can refer to neuro Call with any questions or concerns Hang in there!!!

## 2016-01-05 NOTE — Progress Notes (Signed)
Pre visit review using our clinic review tool, if applicable. No additional management support is needed unless otherwise documented below in the visit note. 

## 2016-01-06 LAB — ANA: ANA: NEGATIVE

## 2016-01-06 LAB — GLIADIN ANTIBODIES, SERUM
GLIADIN IGA: 10 U (ref <20)
GLIADIN IGG: 2 U (ref <20)

## 2016-01-06 LAB — TISSUE TRANSGLUTAMINASE, IGA: TISSUE TRANSGLUTAMINASE AB, IGA: 1 U/mL (ref <4)

## 2016-01-06 NOTE — Assessment & Plan Note (Signed)
New.  Encouraged pt and wife to watch for association w/ hunger and timing to meals as they report sxs are worse prior to eating and will be associated w/ pallor and diaphoresis.  I told them that this sounds consistent w/ hypoglycemia.  Pt to stop Cinnamon and eat regularly.  If sxs continue, will refer to neuro for complete evaluation.  Pt expressed understanding and is in agreement w/ plan.

## 2016-01-06 NOTE — Assessment & Plan Note (Signed)
Chronic problem, diet controlled.  Since last visit, pt has added Cinnamon- which may be causing him hypoglycemia.  UTD on eye exam, foot exam, microalbumin.  Pt is now exercising regularly and has lost weight.  Applauded his efforts.  Check labs.  Adjust meds prn

## 2016-01-06 NOTE — Assessment & Plan Note (Signed)
New.  Episodic.  Not consistent dietary trigger- making Celiac less likely but worth checking.  Suspect IBS rather than IBD given the short duration of sxs.  Check labs.  Start Bentyl prn.  Reviewed supportive care and red flags that should prompt return.  Pt expressed understanding and is in agreement w/ plan.

## 2016-01-08 LAB — RETICULIN ANTIBODIES, IGA W TITER: Reticulin Ab, IgA: NEGATIVE

## 2016-05-24 IMAGING — US US EXTREM LOW VENOUS*L*
1 series · 13 of 24 positions shown · non-contrast
Comparison: None.

CLINICAL DATA: Evaluate left ankle greater saphenous thrombus from
previous ultrasound. Patient remains on blood thinning agent.



[Series 1: us extrem low venous*left* · 0.08mm/px · 13 of 30 slices shown]
[im 1/30]
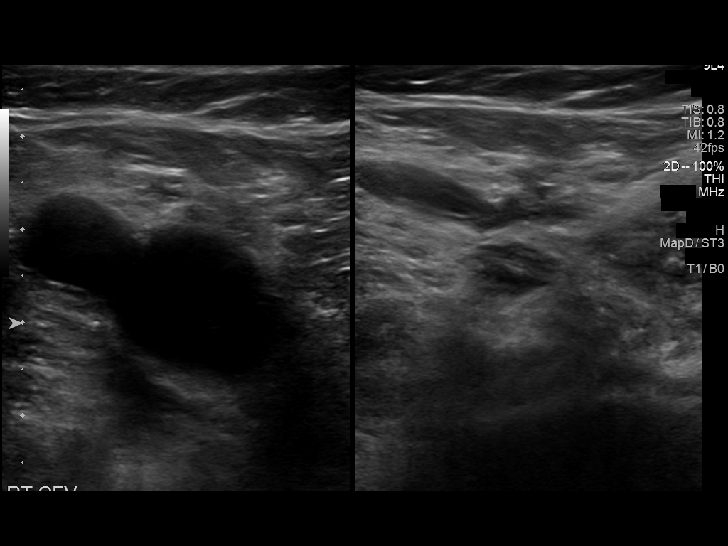
[im 3/30]
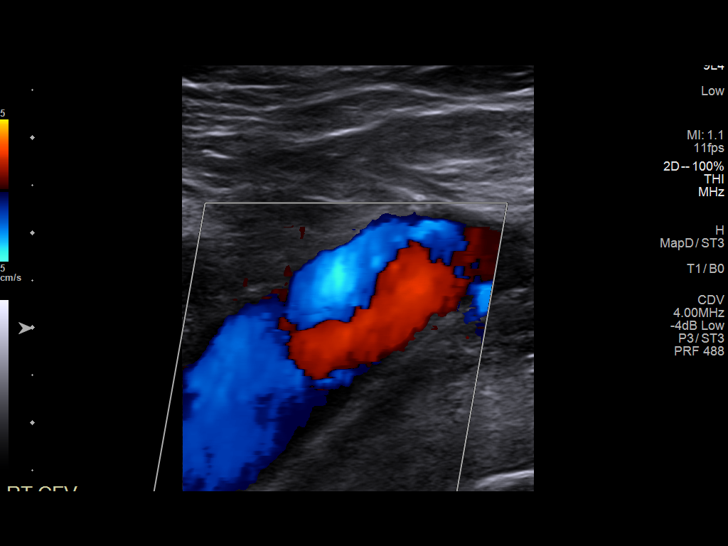
[im 6/30]
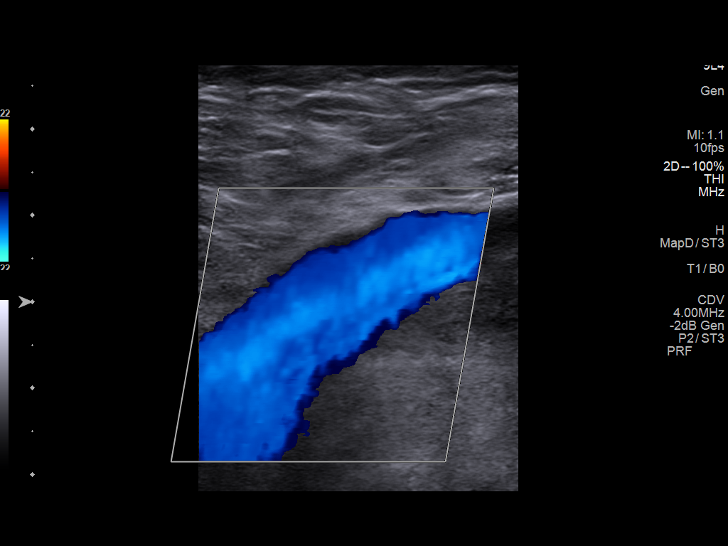
[im 8/30]
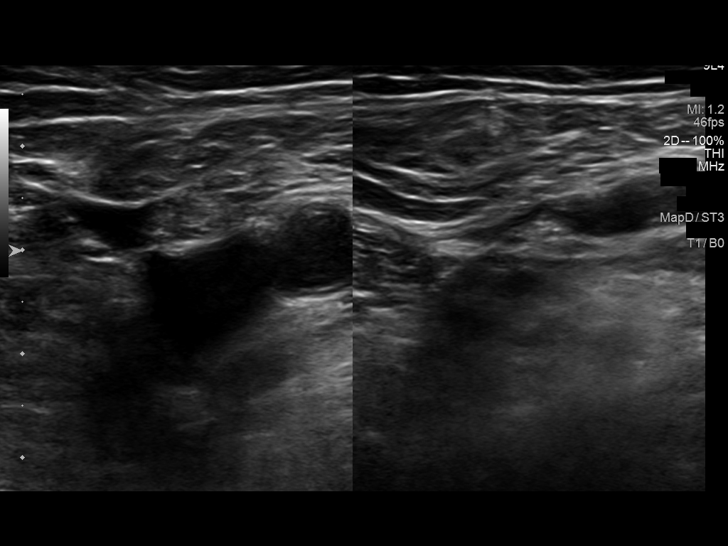
[im 11/30]
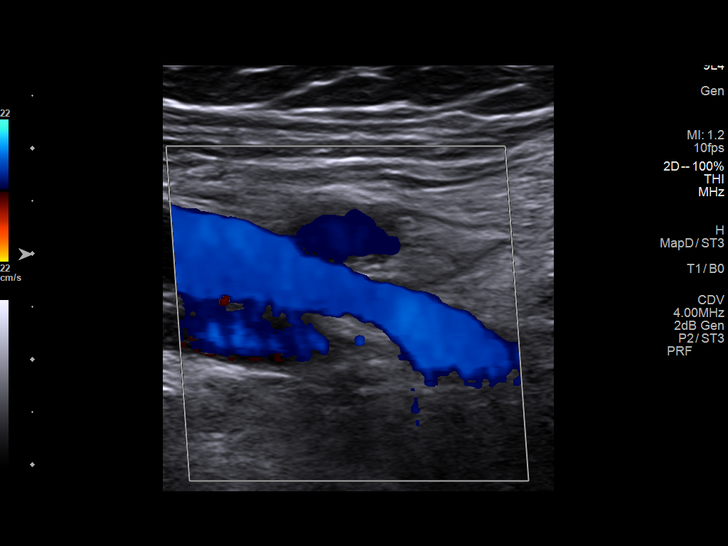
[im 13/30]
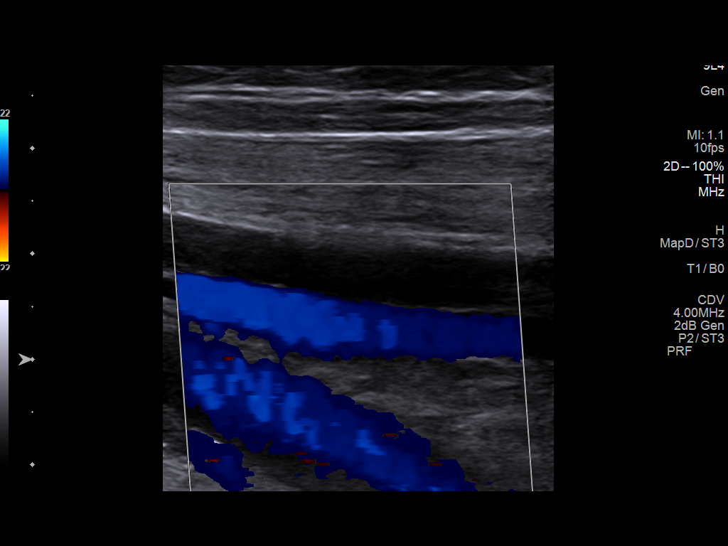
[im 16/30]
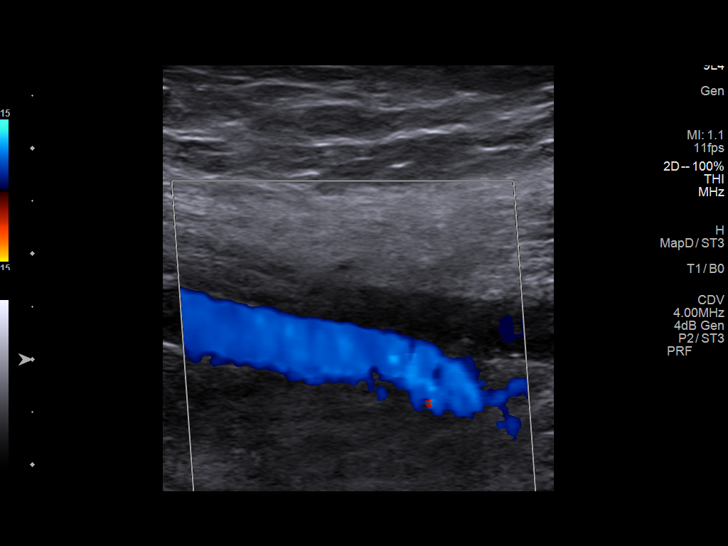
[im 17/30]
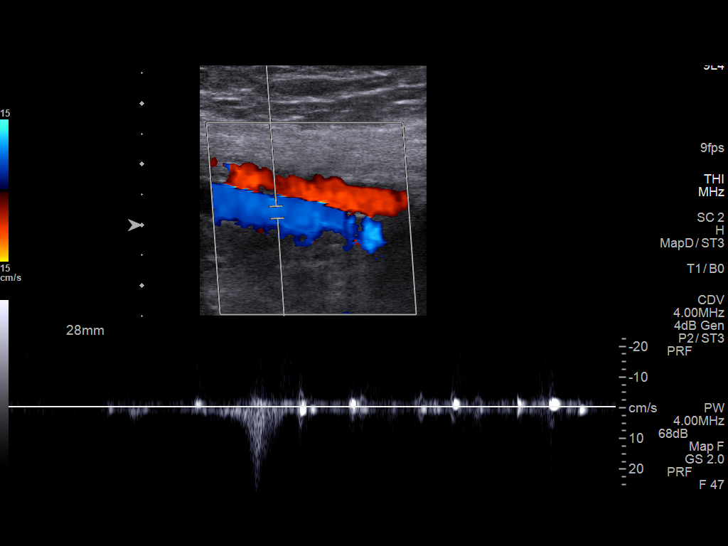
[im 19/30]
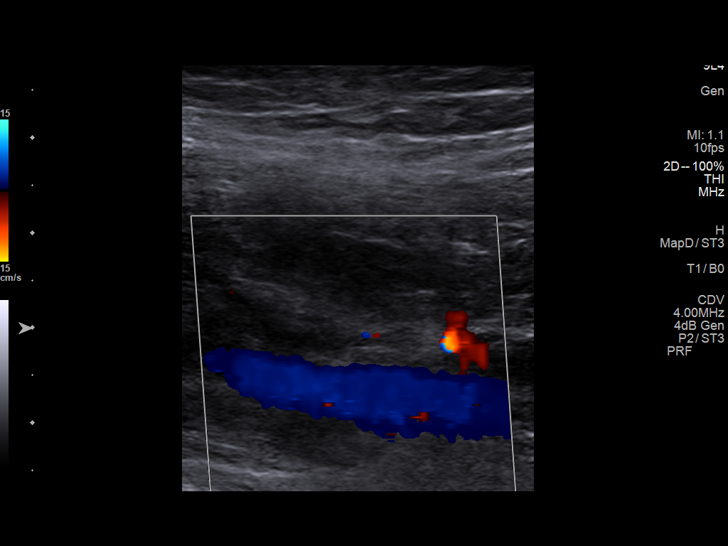
[im 22/30]
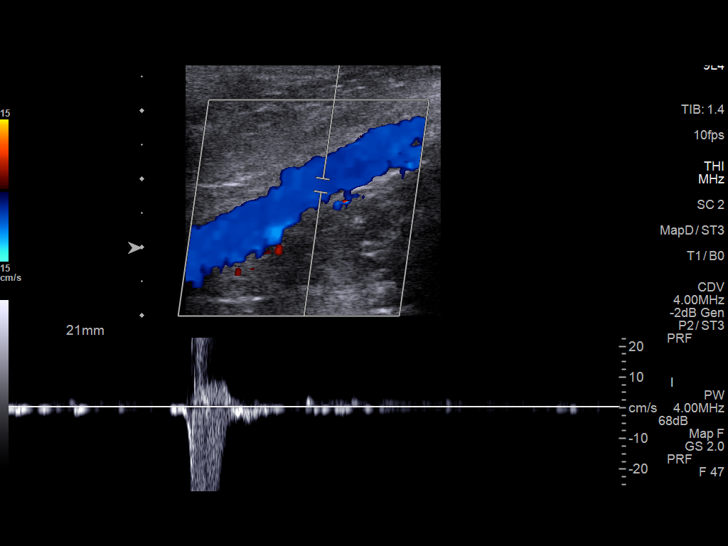
[im 24/30]
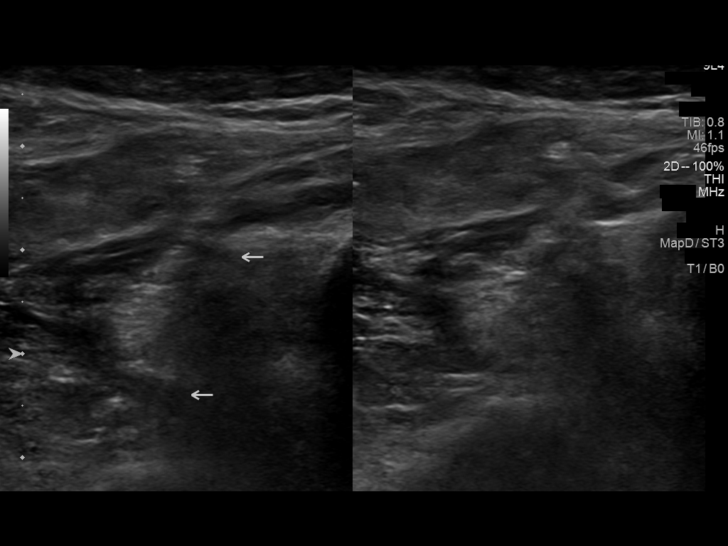
[im 27/30]
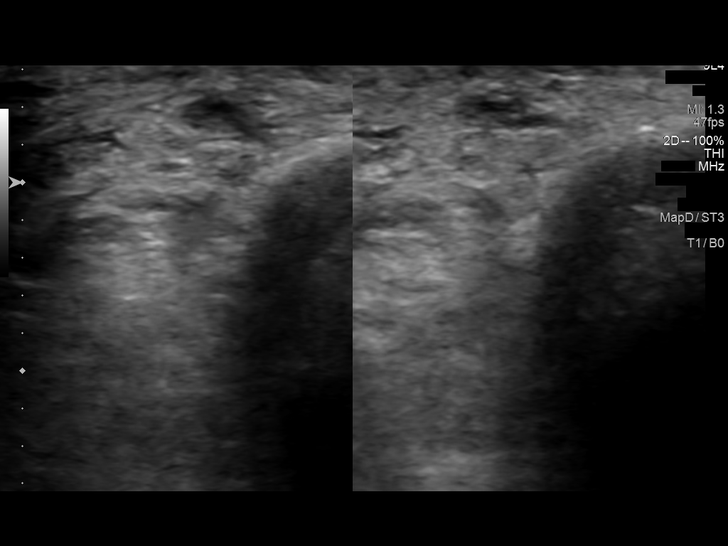
[im 30/30]
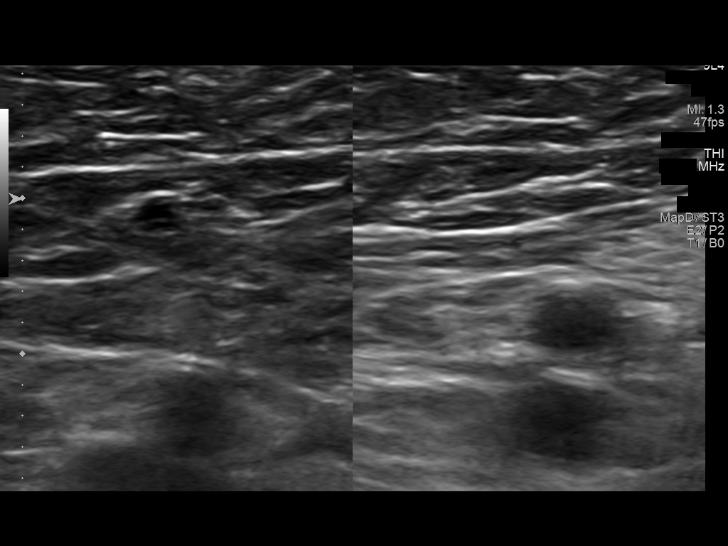

[13 of 24 positions shown; findings below may reference images not displayed]

FINDINGS: Contralateral Common Femoral Vein: Respiratory phasicity is normal
and symmetric with the symptomatic side. No evidence of thrombus.
Normal compressibility.

Common Femoral Vein: No evidence of thrombus. Normal
compressibility, respiratory phasicity and response to augmentation.

Saphenofemoral Junction: No evidence of thrombus. Normal
compressibility and flow on color Doppler imaging.

Profunda Femoral Vein: No evidence of thrombus. Normal
compressibility and flow on color Doppler imaging.

Femoral Vein: No evidence of thrombus. Normal compressibility,
respiratory phasicity and response to augmentation.

Popliteal Vein: No evidence of thrombus. Normal compressibility,
respiratory phasicity and response to augmentation.

Calf Veins: No evidence of thrombus. Normal compressibility and flow
on color Doppler imaging.

Superficial Great Saphenous Vein: There is chronic mixed echogenic
occlusive thrombus within the distal aspect of the greater saphenous
vein at the level of the ankle (representative images 29 and 30).
The greater saphenous vein appears patent and normally compressible
at the level of the knee and thigh. The saphenous femoral junction
is widely patent.

Venous Reflux:  None.

Other Findings:  None.
IMPRESSION: 1. No evidence of DVT within the left lower extremity.
2. Chronic occlusive superficial thrombophlebitis within the distal
aspect of the greater saphenous vein at the level of the ankle,
unchanged since the [DATE] examination. Again, there is no addition
proximal extension of this chronic superficial thrombophlebitis
beyond the level of the ankle.

## 2016-07-13 ENCOUNTER — Encounter: Payer: Self-pay | Admitting: Family Medicine

## 2016-07-13 ENCOUNTER — Ambulatory Visit (INDEPENDENT_AMBULATORY_CARE_PROVIDER_SITE_OTHER): Payer: Medicare Other | Admitting: Family Medicine

## 2016-07-13 VITALS — BP 126/84 | HR 99 | Temp 99.3°F | Resp 17 | Ht 67.0 in | Wt 180.0 lb

## 2016-07-13 DIAGNOSIS — R7989 Other specified abnormal findings of blood chemistry: Secondary | ICD-10-CM | POA: Diagnosis not present

## 2016-07-13 DIAGNOSIS — E119 Type 2 diabetes mellitus without complications: Secondary | ICD-10-CM | POA: Diagnosis not present

## 2016-07-13 DIAGNOSIS — J0120 Acute ethmoidal sinusitis, unspecified: Secondary | ICD-10-CM | POA: Diagnosis not present

## 2016-07-13 LAB — CBC WITH DIFFERENTIAL/PLATELET
BASOS ABS: 0.1 10*3/uL (ref 0.0–0.1)
Basophils Relative: 2 % (ref 0.0–3.0)
EOS PCT: 4.9 % (ref 0.0–5.0)
Eosinophils Absolute: 0.2 10*3/uL (ref 0.0–0.7)
HCT: 42.7 % (ref 39.0–52.0)
Hemoglobin: 14.9 g/dL (ref 13.0–17.0)
LYMPHS ABS: 1.1 10*3/uL (ref 0.7–4.0)
Lymphocytes Relative: 28.3 % (ref 12.0–46.0)
MCHC: 35 g/dL (ref 30.0–36.0)
MCV: 90.2 fl (ref 78.0–100.0)
MONO ABS: 0.8 10*3/uL (ref 0.1–1.0)
MONOS PCT: 20.3 % — AB (ref 3.0–12.0)
NEUTROS ABS: 1.7 10*3/uL (ref 1.4–7.7)
NEUTROS PCT: 44.5 % (ref 43.0–77.0)
PLATELETS: 214 10*3/uL (ref 150.0–400.0)
RBC: 4.73 Mil/uL (ref 4.22–5.81)
RDW: 13 % (ref 11.5–15.5)
WBC: 3.9 10*3/uL — ABNORMAL LOW (ref 4.0–10.5)

## 2016-07-13 LAB — HEPATIC FUNCTION PANEL
ALBUMIN: 4.1 g/dL (ref 3.5–5.2)
ALK PHOS: 37 U/L — AB (ref 39–117)
ALT: 18 U/L (ref 0–53)
AST: 16 U/L (ref 0–37)
Bilirubin, Direct: 0.1 mg/dL (ref 0.0–0.3)
Total Bilirubin: 0.3 mg/dL (ref 0.2–1.2)
Total Protein: 6.4 g/dL (ref 6.0–8.3)

## 2016-07-13 LAB — BASIC METABOLIC PANEL
BUN: 15 mg/dL (ref 6–23)
CHLORIDE: 103 meq/L (ref 96–112)
CO2: 31 mEq/L (ref 19–32)
Calcium: 9.1 mg/dL (ref 8.4–10.5)
Creatinine, Ser: 1.46 mg/dL (ref 0.40–1.50)
GFR: 51.46 mL/min — AB (ref >60.00)
GLUCOSE: 128 mg/dL — AB (ref 70–99)
POTASSIUM: 4.4 meq/L (ref 3.5–5.1)
SODIUM: 140 meq/L (ref 135–145)

## 2016-07-13 LAB — TSH: TSH: 2.55 u[IU]/mL (ref 0.35–4.50)

## 2016-07-13 LAB — LIPID PANEL
CHOLESTEROL: 163 mg/dL (ref 0–200)
HDL: 39.7 mg/dL (ref >39.00)
NonHDL: 123.25
Total CHOL/HDL Ratio: 4
Triglycerides: 292 mg/dL — ABNORMAL HIGH (ref 0.0–149.0)
VLDL: 58.4 mg/dL — AB (ref 0.0–40.0)

## 2016-07-13 LAB — HEMOGLOBIN A1C: Hgb A1c MFr Bld: 6.9 % — ABNORMAL HIGH (ref 4.6–6.5)

## 2016-07-13 LAB — LDL CHOLESTEROL, DIRECT: Direct LDL: 89 mg/dL

## 2016-07-13 MED ORDER — PROMETHAZINE-DM 6.25-15 MG/5ML PO SYRP: 5.0000 mL | ORAL_SOLUTION | Freq: Four times a day (QID) | ORAL | 0 refills | Status: DC | PRN

## 2016-07-13 MED ORDER — AMOXICILLIN 875 MG PO TABS: 875.0000 mg | ORAL_TABLET | Freq: Two times a day (BID) | ORAL | 0 refills | Status: DC

## 2016-07-13 NOTE — Assessment & Plan Note (Signed)
Pt is overdue for diabetes f/u.  UTD on foot exam, eye exam, microalbumin.  Since he is not compliant w/ appts, will get labs while he is here today and determine if adjustments need to be made.  Pt expressed understanding and is in agreement w/ plan.

## 2016-07-13 NOTE — Progress Notes (Signed)
Pre visit review using our clinic review tool, if applicable. No additional management support is needed unless otherwise documented below in the visit note. 

## 2016-07-13 NOTE — Patient Instructions (Signed)
Schedule your complete physical and Annual Wellness Visit (with our health coach, Selena BattenKim) in 6 months We'll notify you of your lab results and make any changes if needed Start the Amoxicillin twice daily- take w/ food Drink plenty of fluids REST!! Cough medicine as needed- may cause drowsiness Mucinex DM for daytime cough without drowsiness Call with any questions or concerns Hang in there!!!

## 2016-07-13 NOTE — Progress Notes (Signed)
   Subjective:    Patient ID: Connor Griffith, male    DOB: [birth year], 66 y.o.   MRN: [id number]  HPI URI- sxs started a few days ago.  'scratchy throat, cough, headaches'.  Subjective fever last night.  No ear pain, body aches.  HA is located behind eyes.  Cough comes in 'fits'- not productive.  No known sick contacts.  DM- currently diet controlled.  Pt has not followed up as scheduled.  UTD on foot exam, eye exam.     Review of Systems For ROS see HPI     Objective:   Physical Exam  Constitutional: He is oriented to person, place, and time. He appears well-developed and well-nourished. No distress.  HENT:  Head: Normocephalic and atraumatic.  Right Ear: Tympanic membrane normal.  Left Ear: Tympanic membrane normal.  Nose: Mucosal edema and rhinorrhea present. Right sinus exhibits maxillary sinus tenderness and frontal sinus tenderness. Left sinus exhibits maxillary sinus tenderness and frontal sinus tenderness.  Mouth/Throat: Mucous membranes are normal. Oropharyngeal exudate and posterior oropharyngeal erythema present. No posterior oropharyngeal edema.  + PND  Eyes: Conjunctivae and EOM are normal. Pupils are equal, round, and reactive to light.  Neck: Normal range of motion. Neck supple.  Cardiovascular: Normal rate, regular rhythm and normal heart sounds.   Pulmonary/Chest: Effort normal and breath sounds normal. No respiratory distress. He has no wheezes.  + cough  Lymphadenopathy:    He has no cervical adenopathy.  Neurological: He is alert and oriented to person, place, and time.  Skin: Skin is warm and dry.  Psychiatric: He has a normal mood and affect. His behavior is normal. Thought content normal.  Vitals reviewed.         Assessment & Plan:  Sinusitis- new.  Pt's sxs are not consistent w/ flu but are consistent w/ sinusitis.  Start Amox.  Cough meds prn.  Reviewed supportive care and red flags that should prompt return.  Pt expressed understanding and is in  agreement w/ plan.

## 2016-07-14 ENCOUNTER — Encounter: Payer: Self-pay | Admitting: General Practice

## 2016-09-22 ENCOUNTER — Emergency Department (HOSPITAL_BASED_OUTPATIENT_CLINIC_OR_DEPARTMENT_OTHER)
Admission: EM | Admit: 2016-09-22 | Discharge: 2016-09-22 | Disposition: A | Discharge status: Home / Self Care | Payer: Medicare Other | Attending: Physician Assistant | Admitting: Physician Assistant

## 2016-09-22 ENCOUNTER — Encounter (HOSPITAL_BASED_OUTPATIENT_CLINIC_OR_DEPARTMENT_OTHER): Payer: Self-pay | Admitting: Emergency Medicine

## 2016-09-22 DIAGNOSIS — Y998 Other external cause status: Secondary | ICD-10-CM | POA: Diagnosis not present

## 2016-09-22 DIAGNOSIS — Z7982 Long term (current) use of aspirin: Secondary | ICD-10-CM | POA: Insufficient documentation

## 2016-09-22 DIAGNOSIS — X509XXA Other and unspecified overexertion or strenuous movements or postures, initial encounter: Secondary | ICD-10-CM | POA: Diagnosis not present

## 2016-09-22 DIAGNOSIS — Y9374 Activity, frisbee: Secondary | ICD-10-CM | POA: Diagnosis not present

## 2016-09-22 DIAGNOSIS — Y929 Unspecified place or not applicable: Secondary | ICD-10-CM | POA: Insufficient documentation

## 2016-09-22 DIAGNOSIS — F17290 Nicotine dependence, other tobacco product, uncomplicated: Secondary | ICD-10-CM | POA: Diagnosis not present

## 2016-09-22 DIAGNOSIS — S39012A Strain of muscle, fascia and tendon of lower back, initial encounter: Secondary | ICD-10-CM | POA: Diagnosis not present

## 2016-09-22 DIAGNOSIS — M549 Dorsalgia, unspecified: Secondary | ICD-10-CM | POA: Diagnosis present

## 2016-09-22 DIAGNOSIS — E119 Type 2 diabetes mellitus without complications: Secondary | ICD-10-CM | POA: Diagnosis not present

## 2016-09-22 LAB — URINALYSIS, ROUTINE W REFLEX MICROSCOPIC
BILIRUBIN URINE: NEGATIVE
GLUCOSE, UA: 250 mg/dL — AB
Hgb urine dipstick: NEGATIVE
Ketones, ur: NEGATIVE mg/dL
Leukocytes, UA: NEGATIVE
NITRITE: NEGATIVE
PH: 5.5 (ref 5.0–8.0)
Protein, ur: NEGATIVE mg/dL
Specific Gravity, Urine: 1.031 — ABNORMAL HIGH (ref 1.005–1.030)

## 2016-09-22 MED ORDER — IBUPROFEN 800 MG PO TABS: 800.0000 mg | ORAL_TABLET | Freq: Three times a day (TID) | ORAL | 0 refills | Status: DC

## 2016-09-22 MED ORDER — CYCLOBENZAPRINE HCL 10 MG PO TABS: 10.0000 mg | ORAL_TABLET | Freq: Two times a day (BID) | ORAL | 0 refills | Status: DC | PRN

## 2016-09-22 MED FILL — tiZANidine HCL 4 MG TABS: 4 | 5 days supply | Qty: 10 | Fill #0

## 2016-09-22 MED FILL — IBUPROFEN 800 MG TAB: 800 | 7 days supply | Qty: 21 | Fill #0

## 2016-09-22 NOTE — Discharge Instructions (Signed)
Use either heat or ice to help with your pain. We think this is likely a lumbar strain. Please return with any fever, change in pain or concerns.

## 2016-09-22 NOTE — ED Provider Notes (Signed)
[facility] Note   CSN: [id number] Arrival date & time: 09/22/16  [id number]     History   Chief Complaint Chief Complaint  Patient presents with  . Flank Pain    HPI Connor Griffith is a 66 y.o. male.  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 He reports it's worse with moving his left arm or stretching his back. Patient reports that he was playing Frisbee with his son 2 days ago. Since then he's had mild pain got worse last night.  Patient had no fever. No nausea no vomiting. No cough. No weakness. No change in sensation  Past Medical History:  Diagnosis Date  . Arthritis of knee    right  . Cataract    left  . DVT (deep venous thrombosis) (HCC)   . History of chicken pox   . Jaundice   . Polio    as a child    Patient Active Problem List   Diagnosis Date Noted  . Non-infective diarrhea 01/05/2016  . Tremor of both hands 01/05/2016  . 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 Acquired flat foot 07/09/2014  . DM II (diabetes mellitus, type II), controlled (HCC) 08/28/2013  . Routine general medical examination at a health care facility 05/20/2013  . Left leg DVT (HCC) 03/14/2013  . Hyperlipidemia 04/28/2011  . MALLET FINGER 03/01/2010  . PARESTHESIA 01/20/2010    Past Surgical History:  Procedure Laterality Date  . CATARACT EXTRACTION     left  . KNEE ARTHROSCOPY     right knee x2  . LEG SURGERY     several, left secondary to polio  . RETINAL DETACHMENT SURGERY     left eye  . TONSILLECTOMY         Home Medications    Prior to Admission medications   Medication Sig Start Date End Date Taking? Authorizing Provider  amoxicillin (AMOXIL) 875 MG tablet Take 1 tablet (875 mg total) by mouth 2 (two) times daily. 07/13/16   Sheliah Hatchabori, Katherine E, MD  aspirin 325 MG EC tablet Take 325 mg by mouth daily.    [provider]  cyclobenzaprine (FLEXERIL) 10 MG tablet Take 1 tablet (10 mg total) by  mouth 2 (two) times daily as needed for muscle spasms. 09/22/16   Nikisha Fleece Lyn, MD  dicyclomine (BENTYL) 20 MG tablet Take 1 tablet (20 mg total) by mouth 4 (four) times daily -  before meals and at bedtime. As needed for abdominal cramping/spasm 01/05/16   Sheliah Hatchabori, Katherine E, MD  ibuprofen (ADVIL,MOTRIN) 800 MG tablet Take 1 tablet (800 mg total) by mouth 3 (three) times daily. 09/22/16   Weylyn Ricciuti Lyn, MD  Multiple Vitamins-Minerals (CENTRUM SILVER PO) Take 1 capsule by mouth.    [provider]  promethazine-dextromethorphan (PROMETHAZINE-DM) 6.25-15 MG/5ML syrup Take 5 mLs by mouth 4 (four) times daily as needed. 07/13/16   Sheliah Hatchabori, Katherine E, MD    Family History Family History  Problem Relation Age of Onset  . Hypertension Mother   . Diabetes Brother   . Heart disease Brother     Social History Social History  Substance Use Topics  . Smoking status: Current Some Day Smoker    Years: 20.00    Types: Cigars    Start date: 07/12/1993  . Smokeless tobacco: Never Used     Comment: smokes 2 or 3 cigars weekly  . Alcohol use No     Allergies  Patient has no known allergies.   Review of Systems Review of Systems  Constitutional: Negative for activity change.  Respiratory: Negative for shortness of breath.   Cardiovascular: Negative for chest pain.  Gastrointestinal: Negative for abdominal pain.     Physical Exam Updated Vital Signs BP (!) 156/79 (BP Location: Left Arm)   Pulse 61   Temp 98.6 F (37 C) (Oral)   Resp 16   Ht 5\' 7"  (1.702 m)   Wt 170 lb (77.1 kg)   SpO2 97%   BMI 26.63 kg/m   Physical Exam  Constitutional: He is oriented to person, place, and time. He appears well-nourished.  HENT:  Head: Normocephalic.  Eyes: Conjunctivae are normal.  Cardiovascular: Normal rate.   Pulmonary/Chest: Effort normal and breath sounds normal.  Musculoskeletal:  Pain to right paraspainal L spine distributin with moving left arm. No bony  tenderness.  Good strenght and sensation of bilateral UE.   Neurological: He is oriented to person, place, and time.  Skin: Skin is warm and dry. He is not diaphoretic.  Psychiatric: He has a normal mood and affect. His behavior is normal.     ED Treatments / Results  Labs (all labs ordered are listed, but only abnormal results are displayed) Labs Reviewed  URINALYSIS, ROUTINE W REFLEX MICROSCOPIC - Abnormal; Notable for the following:       Result Value   Specific Gravity, Urine 1.031 (*)    Glucose, UA 250 (*)    All other components within normal limits    EKG  EKG Interpretation None       Radiology No results found.  Procedures Procedures (including critical care time)  Medications Ordered in ED Medications - No data to display   Initial Impression / Assessment and Plan / ED Course  I have reviewed the triage vital signs and the nursing notes.  Pertinent labs & imaging results that were available during my care of the patient were reviewed by me and considered in my medical decision making (see chart for details).     Likely lumbar strain. Normal PE. Will treat symtpomatically. Urine normal. Follow up as outpatient.   Final Clinical Impressions(s) / ED Diagnoses   Final diagnoses:  Strain of lumbar region, initial encounter    New Prescriptions New Prescriptions   CYCLOBENZAPRINE (FLEXERIL) 10 MG TABLET    Take 1 tablet (10 mg total) by mouth 2 (two) times daily as needed for muscle spasms.   IBUPROFEN (ADVIL,MOTRIN) 800 MG TABLET    Take 1 tablet (800 mg total) by mouth 3 (three) times daily.     Abelino Derrick, MD 09/22/16 1024

## 2016-09-22 NOTE — ED Triage Notes (Signed)
Pt c/o RT flank pain x 2 d; sts worse with movement

## 2016-11-30 LAB — HM DIABETES EYE EXAM

## 2017-01-05 ENCOUNTER — Encounter: Payer: Self-pay | Admitting: *Deleted

## 2017-01-11 ENCOUNTER — Encounter: Payer: Self-pay | Admitting: Family Medicine

## 2017-01-11 ENCOUNTER — Ambulatory Visit (INDEPENDENT_AMBULATORY_CARE_PROVIDER_SITE_OTHER): Payer: Medicare Other | Admitting: Family Medicine

## 2017-01-11 VITALS — BP 133/84 | HR 64 | Temp 98.2°F | Resp 17 | Ht 67.0 in | Wt 174.4 lb

## 2017-01-11 DIAGNOSIS — Z23 Encounter for immunization: Secondary | ICD-10-CM | POA: Diagnosis not present

## 2017-01-11 DIAGNOSIS — Z125 Encounter for screening for malignant neoplasm of prostate: Secondary | ICD-10-CM

## 2017-01-11 DIAGNOSIS — Z0000 Encounter for general adult medical examination without abnormal findings: Secondary | ICD-10-CM

## 2017-01-11 DIAGNOSIS — E119 Type 2 diabetes mellitus without complications: Secondary | ICD-10-CM | POA: Diagnosis not present

## 2017-01-11 LAB — CBC WITH DIFFERENTIAL/PLATELET
Basophils Absolute: 0.1 10*3/uL (ref 0.0–0.1)
Basophils Relative: 0.9 % (ref 0.0–3.0)
EOS ABS: 0.2 10*3/uL (ref 0.0–0.7)
EOS PCT: 2.9 % (ref 0.0–5.0)
HCT: 43.1 % (ref 39.0–52.0)
Hemoglobin: 14.7 g/dL (ref 13.0–17.0)
LYMPHS ABS: 1.9 10*3/uL (ref 0.7–4.0)
Lymphocytes Relative: 29.4 % (ref 12.0–46.0)
MCHC: 34.1 g/dL (ref 30.0–36.0)
MCV: 91.6 fl (ref 78.0–100.0)
MONO ABS: 0.4 10*3/uL (ref 0.1–1.0)
Monocytes Relative: 5.8 % (ref 3.0–12.0)
NEUTROS PCT: 61 % (ref 43.0–77.0)
Neutro Abs: 4 10*3/uL (ref 1.4–7.7)
Platelets: 230 10*3/uL (ref 150.0–400.0)
RBC: 4.71 Mil/uL (ref 4.22–5.81)
RDW: 12.9 % (ref 11.5–15.5)
WBC: 6.6 10*3/uL (ref 4.0–10.5)

## 2017-01-11 LAB — HEPATIC FUNCTION PANEL
ALBUMIN: 4.3 g/dL (ref 3.5–5.2)
ALK PHOS: 33 U/L — AB (ref 39–117)
ALT: 14 U/L (ref 0–53)
AST: 14 U/L (ref 0–37)
Bilirubin, Direct: 0.1 mg/dL (ref 0.0–0.3)
Total Bilirubin: 0.5 mg/dL (ref 0.2–1.2)
Total Protein: 6.4 g/dL (ref 6.0–8.3)

## 2017-01-11 LAB — BASIC METABOLIC PANEL
BUN: 19 mg/dL (ref 6–23)
CO2: 30 mEq/L (ref 19–32)
Calcium: 9.2 mg/dL (ref 8.4–10.5)
Chloride: 103 mEq/L (ref 96–112)
Creatinine, Ser: 1.06 mg/dL (ref 0.40–1.50)
GFR: 74.35 mL/min (ref >60.00)
Glucose, Bld: 216 mg/dL — ABNORMAL HIGH (ref 70–99)
Potassium: 4 mEq/L (ref 3.5–5.1)
Sodium: 140 mEq/L (ref 135–145)

## 2017-01-11 LAB — TSH: TSH: 2.57 u[IU]/mL (ref 0.35–4.50)

## 2017-01-11 LAB — MICROALBUMIN / CREATININE URINE RATIO
Creatinine,U: 218.3 mg/dL
MICROALB/CREAT RATIO: 0.4 mg/g (ref 0.0–30.0)
Microalb, Ur: 0.9 mg/dL (ref 0.0–1.9)

## 2017-01-11 LAB — LIPID PANEL
CHOL/HDL RATIO: 4
Cholesterol: 166 mg/dL (ref 0–200)
HDL: 42.5 mg/dL (ref >39.00)
LDL CALC: 94 mg/dL (ref 0–99)
NONHDL: 123.94
TRIGLYCERIDES: 149 mg/dL (ref 0.0–149.0)
VLDL: 29.8 mg/dL (ref 0.0–40.0)

## 2017-01-11 LAB — PSA, MEDICARE: PSA: 0.32 ng/ml (ref 0.10–4.00)

## 2017-01-11 LAB — HEMOGLOBIN A1C: HEMOGLOBIN A1C: 6.8 % — AB (ref 4.6–6.5)

## 2017-01-11 NOTE — Assessment & Plan Note (Signed)
Chronic problem.  Currently diet controlled.  UTD on eye exam.  Foot exam done today.  Due for microalbumin.  Check labs.  Adjust tx plan prn.

## 2017-01-11 NOTE — Addendum Note (Signed)
Addended by: Geannie RisenBRODMERKEL, JESSICA L on: 01/11/2017 02:25 PM   Modules accepted: Orders

## 2017-01-11 NOTE — Progress Notes (Signed)
Pre visit review using our clinic review tool, if applicable. No additional management support is needed unless otherwise documented below in the visit note. 

## 2017-01-11 NOTE — Assessment & Plan Note (Signed)
Pt's PE WNL and unchanged from previous.  UTD on colonoscopy.  Prevnar given today.  Written screening schedule updated and given to pt.  Check labs.  Anticipatory guidance provided.

## 2017-01-11 NOTE — Patient Instructions (Signed)
Follow up in 6 months to recheck sugar and cholesterol We'll notify you of your lab results and make any changes if needed Continue to work on healthy diet and regular exercise- you look great! You are up to date on colonoscopy until 2020- yay! Call with any questions or concerns Happy Labor Day and Happy Retirement!!!

## 2017-01-11 NOTE — Progress Notes (Addendum)
   Subjective:    Patient ID: Connor Griffith, male    DOB: [birth year], 66 y.o.   MRN: [id number]  HPI Here today for Welcome to Medicare Visit.  Risk Factors: DM- currently diet controlled w/ A1C of 6.9.  UTD on eye exam.  Due for foot exam and microalbumin Physical Activity: riding bike 30 minutes/day Fall Risk: low Depression: denies current sxs Hearing: normal to conversational tones and whispered voice at 6 ft ADL's: independent Cognitive: normal linear thought process, memory and attention intact Home Safety: safe at home, lives w/ wife Height, Weight, BMI, Visual Acuity: see vitals, vision corrected to 20/20 w/ glasses Counseling: UTD on colonoscopy, eye exam.  Due for foot exam.  Due for Prevnar. Health care POA/Living Will: pt has both of these completed Labs Ordered: See A&P Care Plan: See A&P    Review of Systems Patient reports no vision/hearing changes, anorexia, fever ,adenopathy, persistant/recurrent hoarseness, swallowing issues, chest pain, palpitations, edema, persistant/recurrent cough, hemoptysis, dyspnea (rest,exertional, paroxysmal nocturnal), gastrointestinal  bleeding (melena, rectal bleeding), abdominal pain, excessive heart burn, GU symptoms (dysuria, hematuria, voiding/incontinence issues) syncope, focal weakness, memory loss, skin/hair/nail changes, depression, anxiety, abnormal bruising/bleeding, musculoskeletal symptoms/signs.   + chronic numbness of R foot    Objective:   Physical Exam BP 133/84   Pulse 64   Temp 98.2 F (36.8 C) (Oral)   Resp 17   Ht 5\' 7"  (1.702 m)   Wt 174 lb 6 oz (79.1 kg)   SpO2 98%   BMI 27.31 kg/m   General Appearance:    Alert, cooperative, no distress, appears stated age  Head:    Normocephalic, without obvious abnormality, atraumatic  Eyes:    PERRL, conjunctiva/corneas clear, EOM's intact, fundi    benign, both eyes       Ears:    Normal TM's and external ear canals, both ears  Nose:   Nares normal, septum  midline, mucosa normal, no drainage   or sinus tenderness  Throat:   Lips, mucosa, and tongue normal; teeth and gums normal  Neck:   Supple, symmetrical, trachea midline, no adenopathy;       thyroid:  No enlargement/tenderness/nodules  Back:     Symmetric, no curvature, ROM normal, no CVA tenderness  Lungs:     Clear to auscultation bilaterally, respirations unlabored  Chest wall:    No tenderness or deformity  Heart:    Regular rate and rhythm, S1 and S2 normal, no murmur, rub   or gallop  Abdomen:     Soft, non-tender, bowel sounds active all four quadrants,    no masses, no organomegaly  Genitalia:    Normal male without lesion, discharge or tenderness  Rectal:    Normal tone, normal prostate, no masses or tenderness, hemorrhoid skin tags present  Extremities:   Extremities normal, atraumatic, no cyanosis or edema  Pulses:   2+ and symmetric all extremities  Skin:   Skin color, texture, turgor normal, no rashes or lesions  Lymph nodes:   Cervical, supraclavicular, and axillary nodes normal  Neurologic:   CNII-XII intact. Normal strength, sensation and reflexes      throughout          Assessment & Plan:

## 2017-01-12 ENCOUNTER — Encounter: Payer: Self-pay | Admitting: General Practice

## 2017-09-20 ENCOUNTER — Encounter: Payer: Self-pay | Admitting: Podiatry

## 2017-09-20 ENCOUNTER — Ambulatory Visit: Payer: Medicare Other | Admitting: Podiatry

## 2017-09-20 DIAGNOSIS — M21969 Unspecified acquired deformity of unspecified lower leg: Secondary | ICD-10-CM

## 2017-09-20 DIAGNOSIS — M6701 Short Achilles tendon (acquired), right ankle: Secondary | ICD-10-CM | POA: Diagnosis not present

## 2017-09-20 DIAGNOSIS — M7741 Metatarsalgia, right foot: Secondary | ICD-10-CM | POA: Diagnosis not present

## 2017-09-20 NOTE — Progress Notes (Signed)
SUBJECTIVE: 67 y.o. year old male presents complaining of pain at the ball of right foot and ankle more a the end of the day. Left foot had Polio at age 89 affect and having on going problem that had to be accommodated.  DVT on left lower limb 3-4 years ago.  Review of Systems  Constitutional: Negative.   HENT: Negative.   Eyes: Negative.   Respiratory: Negative.   Cardiovascular: Negative.   Gastrointestinal: Negative.   Genitourinary: Negative.   Musculoskeletal: Negative.   Skin: Negative.      OBJECTIVE: DERMATOLOGIC EXAMINATION: No abnormal findings.  VASCULAR EXAMINATION OF LOWER LIMBS: All pedal pulses are palpable with normal pulsation.  Capillary Filling times within 3 seconds in all digits.  No edema or erythema noted. Temperature gradient from tibial crest to dorsum of foot is within normal bilateral.  NEUROLOGIC EXAMINATION OF THE LOWER LIMBS: All epicritic and tactile sensations grossly intact. Sharp and Dull discriminatory sensations at the plantar ball of hallux is intact bilateral.   MUSCULOSKELETAL EXAMINATION: Positive for severe elevated first ray with plantar flexed great toe left. Elevated first ray right with tight Achilles tendon right.   RADIOGRAPHIC STUDIES:  Severely elevated first metatarsal bone with plantar flexed great toe left foot. Mild first ray elevation with normal forefoot and rear foot arrangements. No acute changes noted.  ASSESSMENT: Achilles tendon contracture right. Lateral weight shifting right. Mild forefoot varus right. Severe dorsal elevation of the first MPJ left, post Polio foot.  PLAN: Reviewed findings and available treatment options. Reviewed stretch exercise. May benefit from custom orthotics and possible Equinus brace. Patient is to return in one month and bring his old orthotics.

## 2017-09-20 NOTE — Patient Instructions (Signed)
Seen for pain in right foot. Noted of tight Achilles tendon with weak first metatarsal bone. Reviewed Stretch exercise. May benefit from custom orthotic and Equinus brace. Return in one month.

## 2017-10-23 ENCOUNTER — Encounter: Payer: Self-pay | Admitting: Podiatry

## 2017-10-23 ENCOUNTER — Ambulatory Visit: Payer: Medicare Other | Admitting: Podiatry

## 2017-10-23 ENCOUNTER — Telehealth: Payer: Self-pay | Admitting: *Deleted

## 2017-10-23 DIAGNOSIS — M7741 Metatarsalgia, right foot: Secondary | ICD-10-CM

## 2017-10-23 DIAGNOSIS — M6701 Short Achilles tendon (acquired), right ankle: Secondary | ICD-10-CM

## 2017-10-23 DIAGNOSIS — M21969 Unspecified acquired deformity of unspecified lower leg: Secondary | ICD-10-CM

## 2017-10-23 NOTE — Telephone Encounter (Signed)
Benefits for CPT code L3020, [id number], L2210 are as follows: deductible does not apply and they  covered at 80%. Ref# for call is [id number]

## 2017-10-23 NOTE — Progress Notes (Signed)
SUBJECTIVE: 67 year old male presents accompanied by his wife for follow up on right foot pain. Stated that he was doing his stretch exercise for tight Achilles tendon on right. He was able to teel the pain is much less now. Still having numbness on the ball of right foot.  He brought old orthotics (soft shell) maid by orthopedist.  History: Polio at age 67 affected left lower limb. History of DVT on left 3-4 years ago.  OBJECTIVE: DERMATOLOGIC EXAMINATION: Normal findings.  VASCULAR EXAMINATION OF LOWER LIMBS: All pedal pulses are palpable in normal pulsation bilateral.  NEUROLOGIC EXAMINATION OF THE LOWER LIMBS: All epicritic and tactile sensations grossly intact.  MUSCULOSKELETAL EXAMINATION: Elevated first ray with tight Achilles tendon on right. Severe first ray elevation with plantar flexed great toe left.  ASSESSMENT: Post Polio foot left, with metatarsal deformity. Forefoot varus right. Tight Achilles tendon right. Metatarsus primus elevatus bilateral. Lesser metatarsalgia with neurologic manifestation right.  PLAN: Reviewed findings. Metatarsal pad placed on existing orthotics and extra pair shoe liner. Both feet casted for Orthotics. Continue with stretch exercise.

## 2017-10-23 NOTE — Patient Instructions (Signed)
Follow up on right foot pain. Making improvement on foot pain by doing daily stretch exercise for tight Achilles tendon. Metatarsal pad placed in shoe liner x 2. Continue with daily stretch exercise. Both feet casted for Orthotics. Will call when they are ready.

## 2017-10-24 NOTE — Telephone Encounter (Signed)
Patient notified and will proceed.

## 2017-11-22 ENCOUNTER — Ambulatory Visit: Payer: Medicare Other | Admitting: Podiatry

## 2017-11-22 ENCOUNTER — Encounter: Payer: Self-pay | Admitting: Podiatry

## 2017-11-22 DIAGNOSIS — M7741 Metatarsalgia, right foot: Secondary | ICD-10-CM | POA: Diagnosis not present

## 2017-11-22 DIAGNOSIS — M24571 Contracture, right ankle: Secondary | ICD-10-CM | POA: Diagnosis not present

## 2017-11-22 DIAGNOSIS — M21969 Unspecified acquired deformity of unspecified lower leg: Secondary | ICD-10-CM | POA: Diagnosis not present

## 2017-11-22 DIAGNOSIS — M6701 Short Achilles tendon (acquired), right ankle: Secondary | ICD-10-CM | POA: Diagnosis not present

## 2017-11-22 NOTE — Progress Notes (Signed)
Subjective: 67 y.o. year old male patient presents for follow up on right foot pain. Orthotics were ready and dispensed with instruction. Patient stated that they are feeling good. Doing stretch exercise as instructed and able to tell some improvement.  History: Polio at age 39 affected left lower limb. History of DVT on left 3-4 years ago.  Objective: Dermatologic: Normal findings. Vascular: Pedal pulses are all palpable. Orthopedic: Severe first ray elevation with plantar flexed left great toe. Elevated first ray with tight Achilles tendon on right. Neurologic: All epicritic and tactile sensations grossly intact.  Assessment: Post Polio left foot with metatarsal deformity. Forefoot varus bilateral. 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 Lesser metatarsalgia with numbness on right.  PLAN: Reviewed findings. A new custom made orthotics dispensed with instruction and patient tested during the office visit. Equinus brace ([id number], L2210) dispensed with instruction.  Equinus Brace Fitting and Dispensing Document: The plastic custom fitted Ankle Foot Orthosis was assembled in accordance with the manufacturer's instructions and based on the specific anatomical and physiological requirements of the patient. This device was custom fitted by me with expertise to provide this service and dispensed for the right foot. The patient was examined while wearing the device after several fitting maneuvers of trimming, bending, shaping, etc. and the fit was found to be appropriate.    Medical necessity: Due to the pain in the foot/ankle/leg with weight bearing throughout the day, with diagnosis of equinus deformity and related symptoms, this is medically necessary for the treatment.  The function of this device is to serve as an anti contracture device of the Gastrocsoleal complex and to restrict and limit motion and help reduce excessive stress and strain to [employer] complex and  foot/ankle/leg. It is being utilized to prevent the plantar contracture of the Gastrocsoleal complex.   The goal of the therapy are to:  1. Treat plantarflexion contracture of the ankle with dorsiflexion, the ankle on passive range of motion testing is noted to have at least 10 degrees (I.e, a non-fixed contracture). 2. Provide a reasonable expectation of the ability to correct the contracture.  3. Reduce the significantly with the beneficiary's functional abilities.  4. Used as a component of a therapy program which includes active stretching of the involved muscles and/or tendons. 5. Treat the beneficiary's plantar fasciitis. Additionally, medial and lateral ankle dorsiflex assistive and plantarflex restraint hinges are included on the brace and were set at 90 degrees of dorsiflexion. These will be periodically adjusted based on future physical examination of the patient's progress.  Instructions and Goals for the Patient: The device will be utilized for the next 8-12 weeks. The intent of these hinges is to resist plantarflexion and assist with dorsiflexion of the Gastrocsoleal complex and/or plantar fascia. These hinges will be adjusted over the course of the patient's therapy. The patient was instructed not to adjust the hinges. Patient was advised to bring the device to the office on next visit for further evaluation and adjustments.  The goals and function of this device was explained in detail to the patient. The patient stated that the device was comfortable when applied. The patient was shown and told in detail how to properly apply, remove, wear, and care for the device. The patient was able to apply and remove the device properly without assistance. Patient as advised not to ambulate with the device in place.  The device was then dispensed and was suitable for the condition and not substandard. No guarantees regarding resolution of symptoms were  given and precautions were reviewed.  Written  instructions and warranty information were given along with the list of the current Durable Medical Equipment Supplier Standards.  The patient signed written proof of delivery. All questions were answered to the patient's satisfaction. The patent also given a follow up appointment in one month.

## 2017-11-22 NOTE — Patient Instructions (Signed)
Doing well with stretching right lower limb. Still tight with some residual pain. Orthotic dispensed. Equinus brace dispensed with instruction.

## 2017-12-25 ENCOUNTER — Ambulatory Visit: Payer: Medicare Other | Admitting: Podiatry

## 2017-12-26 ENCOUNTER — Ambulatory Visit: Payer: Medicare Other | Admitting: Podiatry

## 2018-01-16 NOTE — Progress Notes (Deleted)
Subjective:   Connor Griffith is a 67 y.o. male who presents for an Initial Medicare Annual Wellness Visit.  Review of Systems  No ROS.  Medicare Wellness Visit. Additional risk factors are reflected in the social history.    Sleep patterns: Home Safety/Smoke Alarms: Feels safe in home. Smoke alarms in place.  Living environment; residence and Firearm Safety:  Seat Belt Safety/Bike Helmet: Wears seat belt.    Male:   [facility]-Colonoscopy 05/16/2009     PSA-  Lab Results  Component Value Date   PSA 0.32 01/11/2017   PSA 0.34 07/28/2015   PSA 0.74 05/20/2013      Objective:    There were no vitals filed for this visit. There is no height or weight on file to calculate BMI.  Advanced Directives 09/22/2016 08/03/2015 02/11/2015 10/17/2014 07/18/2014 05/28/2014  Does Patient Have a Medical Advance Directive? Yes Yes Yes No Yes Yes  Type of Estate agent of [place];Living will Healthcare Power of [place];Living will Living will - - -  Copy of Healthcare Power of Attorney in Chart? - No - copy requested No - copy requested - No - copy requested No - copy requested  Would patient like information on creating a medical advance directive? - - - No - patient declined information - -    Current Medications (verified) Outpatient Encounter Medications as of 01/17/2018  Medication Sig  . aspirin 325 MG EC tablet Take 325 mg by mouth daily.  . Multiple Vitamins-Minerals (CENTRUM SILVER PO) Take 1 capsule by mouth.   No facility-administered encounter medications on file as of 01/17/2018.     Allergies (verified) Patient has no known allergies.   History: Past Medical History:  Diagnosis Date  . Arthritis of knee    right  . Cataract    left  . DVT (deep venous thrombosis) (HCC)   . History of chicken pox   . Jaundice   . Polio    as a child   Past Surgical History:  Procedure Laterality Date  . CATARACT EXTRACTION     left  . KNEE ARTHROSCOPY     right knee  x2  . LEG SURGERY     several, left secondary to polio  . RETINAL DETACHMENT SURGERY     left eye  . TONSILLECTOMY     Family History  Problem Relation Age of Onset  . Hypertension Mother   . Diabetes Brother   . Heart disease Brother    Social History   Socioeconomic History  . Marital status: Married    Spouse name: Not on file  . Number of children: Not on file  . Years of education: Not on file  . Highest education level: Not on file  Occupational History  . Not on file  Social Needs  . Financial resource strain: Not on file  . Food insecurity:    Worry: Not on file    Inability: Not on file  . Transportation needs:    Medical: Not on file    Non-medical: Not on file  Tobacco Use  . Smoking status: Current Some Day Smoker    Years: 20.00    Types: Cigars    Start date: 07/12/1993  . Smokeless tobacco: Never Used  . Tobacco comment: smokes 2 or 3 cigars weekly  Substance and Sexual Activity  . Alcohol use: No    Alcohol/week: 0.0 standard drinks  . Drug use: No  . Sexual activity: Not on file  Lifestyle  .  Physical activity:    Days per week: Not on file    Minutes per session: Not on file  . Stress: Not on file  Relationships  . Social connections:    Talks on phone: Not on file    Gets together: Not on file    Attends religious service: Not on file    Active member of club or organization: Not on file    Attends meetings of clubs or organizations: Not on file    Relationship status: Not on file  Other Topics Concern  . Not on file  Social History Narrative   Married, daughter Connor Griffith         Tobacco Counseling Ready to quit: Not Answered Counseling given: Not Answered Comment: smokes 2 or 3 cigars weekly   Activities of Daily Living No flowsheet data found.   Immunizations and Health Maintenance Immunization History  Administered Date(s) Administered  . Influenza, Seasonal, Injecte, Preservative Fre 05/20/2013  . Pneumococcal  Conjugate-13 01/11/2017  . Tdap 07/28/2015   Health Maintenance Due  Topic Date Due  . Hepatitis C Screening  11/21/1950  . FOOT EXAM  07/27/2016  . HEMOGLOBIN A1C  07/13/2017  . OPHTHALMOLOGY EXAM  11/30/2017  . URINE MICROALBUMIN  01/11/2018  . PNA vac Low Risk Adult (2 of 2 - PPSV23) 01/11/2018    Patient Care Team: Sheliah Hatch, MD as PCP - General Larence Penning, OD as Referring Physician (Optometry)  Indicate any recent Medical Services you may have received from other than [facility] providers in the past year (date may be approximate).    Assessment:   This is a routine wellness examination for Connor Griffith.  Hearing/Vision screen No exam data present  Dietary issues and exercise activities discussed:   Diet (meal preparation, eat out, water intake, caffeinated beverages, dairy products, fruits and vegetables):   Breakfast: Lunch:  Dinner:      Goals   None    Depression Screen PHQ 2/9 Scores 01/11/2017 07/28/2015 05/20/2013  PHQ - 2 Score 0 0 0  PHQ- 9 Score 0 - -    Fall Risk Fall Risk  01/11/2017 08/03/2015 07/28/2015 02/11/2015 07/18/2014  Falls in the past year? No No No No No     Cognitive Function:        Screening Tests Health Maintenance  Topic Date Due  . Hepatitis C Screening  [birth year]  . FOOT EXAM  07/27/2016  . HEMOGLOBIN A1C  07/13/2017  . OPHTHALMOLOGY EXAM  11/30/2017  . URINE MICROALBUMIN  01/11/2018  . PNA vac Low Risk Adult (2 of 2 - PPSV23) 01/11/2018  . COLONOSCOPY  05/17/2019  . TETANUS/TDAP  07/27/2025         Plan:     I have personally reviewed and noted the following in the patient's chart:   . Medical and social history . Use of alcohol, tobacco or illicit drugs  . Current medications and supplements . Functional ability and status . Nutritional status . Physical activity . Advanced directives . List of other physicians . Hospitalizations, surgeries, and ER visits in previous 12 months . Vitals . Screenings to  include cognitive, depression, and falls . Referrals and appointments  In addition, I have reviewed and discussed with patient certain preventive protocols, quality metrics, and best practice recommendations. A written personalized care plan for preventive services as well as general preventive health recommendations were provided to patient.     Alysia Penna, RN   01/16/2018

## 2018-01-17 ENCOUNTER — Ambulatory Visit: Payer: Medicare Other

## 2018-03-08 ENCOUNTER — Encounter: Payer: Self-pay | Admitting: General Practice

## 2019-10-12 ENCOUNTER — Emergency Department (HOSPITAL_COMMUNITY)
Admission: EM | Admit: 2019-10-12 | Discharge: 2019-10-12 | Discharge status: Against Medical Advice | Payer: Medicare PPO | Attending: Emergency Medicine | Admitting: Emergency Medicine

## 2019-10-12 ENCOUNTER — Emergency Department (HOSPITAL_COMMUNITY): Payer: Medicare PPO

## 2019-10-12 ENCOUNTER — Encounter (HOSPITAL_COMMUNITY): Payer: Self-pay

## 2019-10-12 DIAGNOSIS — Z5321 Procedure and treatment not carried out due to patient leaving prior to being seen by health care provider: Secondary | ICD-10-CM | POA: Diagnosis not present

## 2019-10-12 DIAGNOSIS — I4891 Unspecified atrial fibrillation: Secondary | ICD-10-CM | POA: Insufficient documentation

## 2019-10-12 HISTORY — DX: Unspecified atrial fibrillation: I48.91

## 2019-10-12 HISTORY — DX: Type 2 diabetes mellitus without complications: E11.9

## 2019-10-12 LAB — BASIC METABOLIC PANEL
Anion gap: 11 (ref 5–15)
BUN: 21 mg/dL (ref 8–23)
CO2: 26 mmol/L (ref 22–32)
Calcium: 9 mg/dL (ref 8.9–10.3)
Chloride: 105 mmol/L (ref 98–111)
Creatinine, Ser: 1.16 mg/dL (ref 0.61–1.24)
GFR calc Af Amer: >60 mL/min (ref >60)
GFR calc non Af Amer: >60 mL/min (ref >60)
Glucose, Bld: 165 mg/dL — ABNORMAL HIGH (ref 70–99)
Potassium: 4.2 mmol/L (ref 3.5–5.1)
Sodium: 142 mmol/L (ref 135–145)

## 2019-10-12 LAB — TROPONIN I (HIGH SENSITIVITY)
Troponin I (High Sensitivity): 9 ng/L (ref <18)
Troponin I (High Sensitivity): 9 ng/L (ref <18)

## 2019-10-12 LAB — CBC
HCT: 44.8 % (ref 39.0–52.0)
Hemoglobin: 15.4 g/dL (ref 13.0–17.0)
MCH: 31.1 pg (ref 26.0–34.0)
MCHC: 34.4 g/dL (ref 30.0–36.0)
MCV: 90.5 fL (ref 80.0–100.0)
Platelets: 228 10*3/uL (ref 150–400)
RBC: 4.95 MIL/uL (ref 4.22–5.81)
RDW: 12.1 % (ref 11.5–15.5)
WBC: 8.3 10*3/uL (ref 4.0–10.5)
nRBC: 0 % (ref 0.0–0.2)

## 2019-10-12 MED ORDER — SODIUM CHLORIDE 0.9% FLUSH: 3.0000 mL | Freq: Once | INTRAVENOUS | Status: DC

## 2019-10-12 NOTE — ED Notes (Signed)
Pt advised against leaving AMA. Pt decided to leave AMA.

## 2019-10-12 NOTE — ED Triage Notes (Signed)
Pt states that he was recently diagnosed with afib in the past week. Converted himself last time, tonight went back into it, and having some SOB and CP.

## 2021-10-08 IMAGING — DX DG CHEST 2V
2 series · 2 of 2 positions shown · non-contrast
Comparison: None.

CLINICAL DATA: Chest pain

EXAM:
CHEST - 2 VIEW

[chest pa]
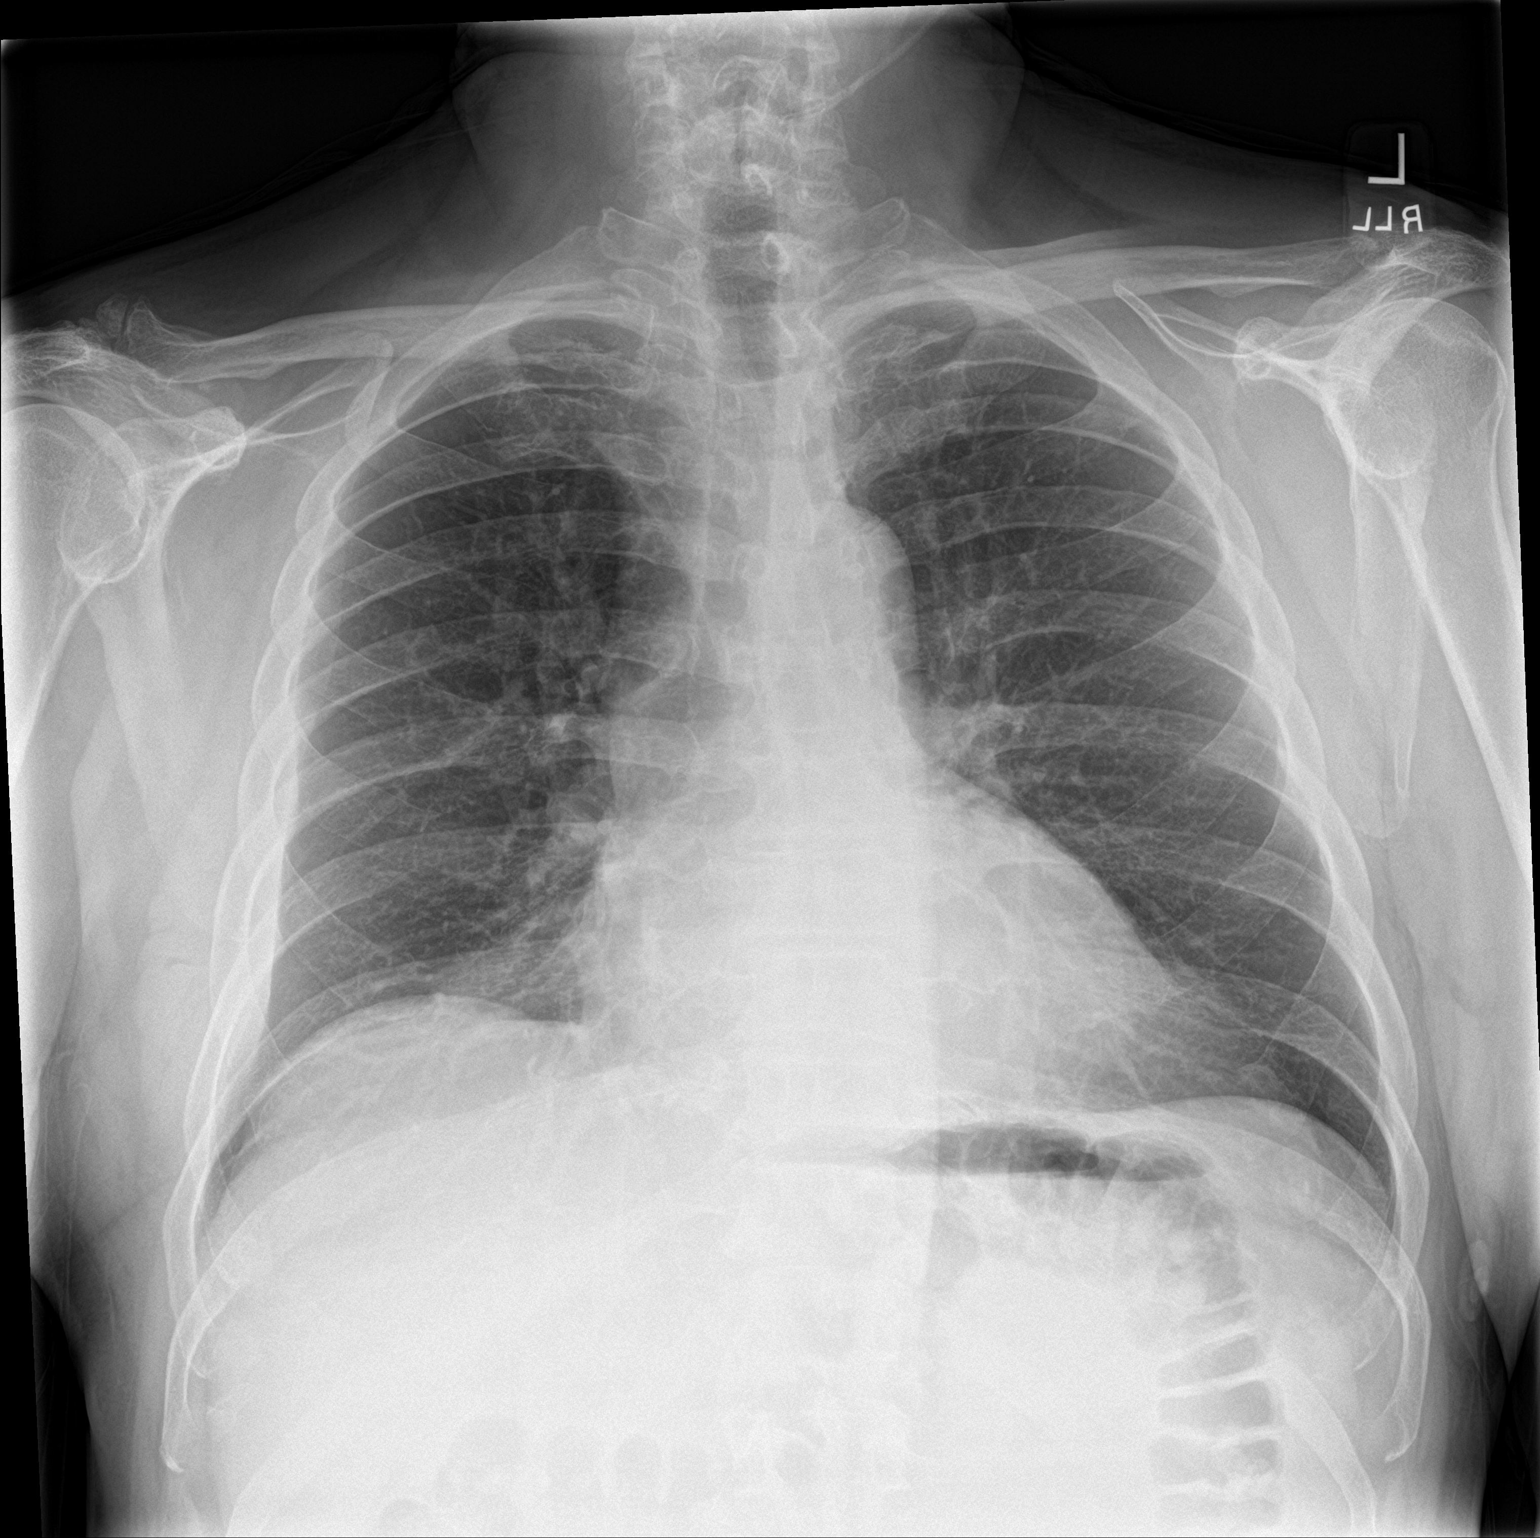

[chest lat]
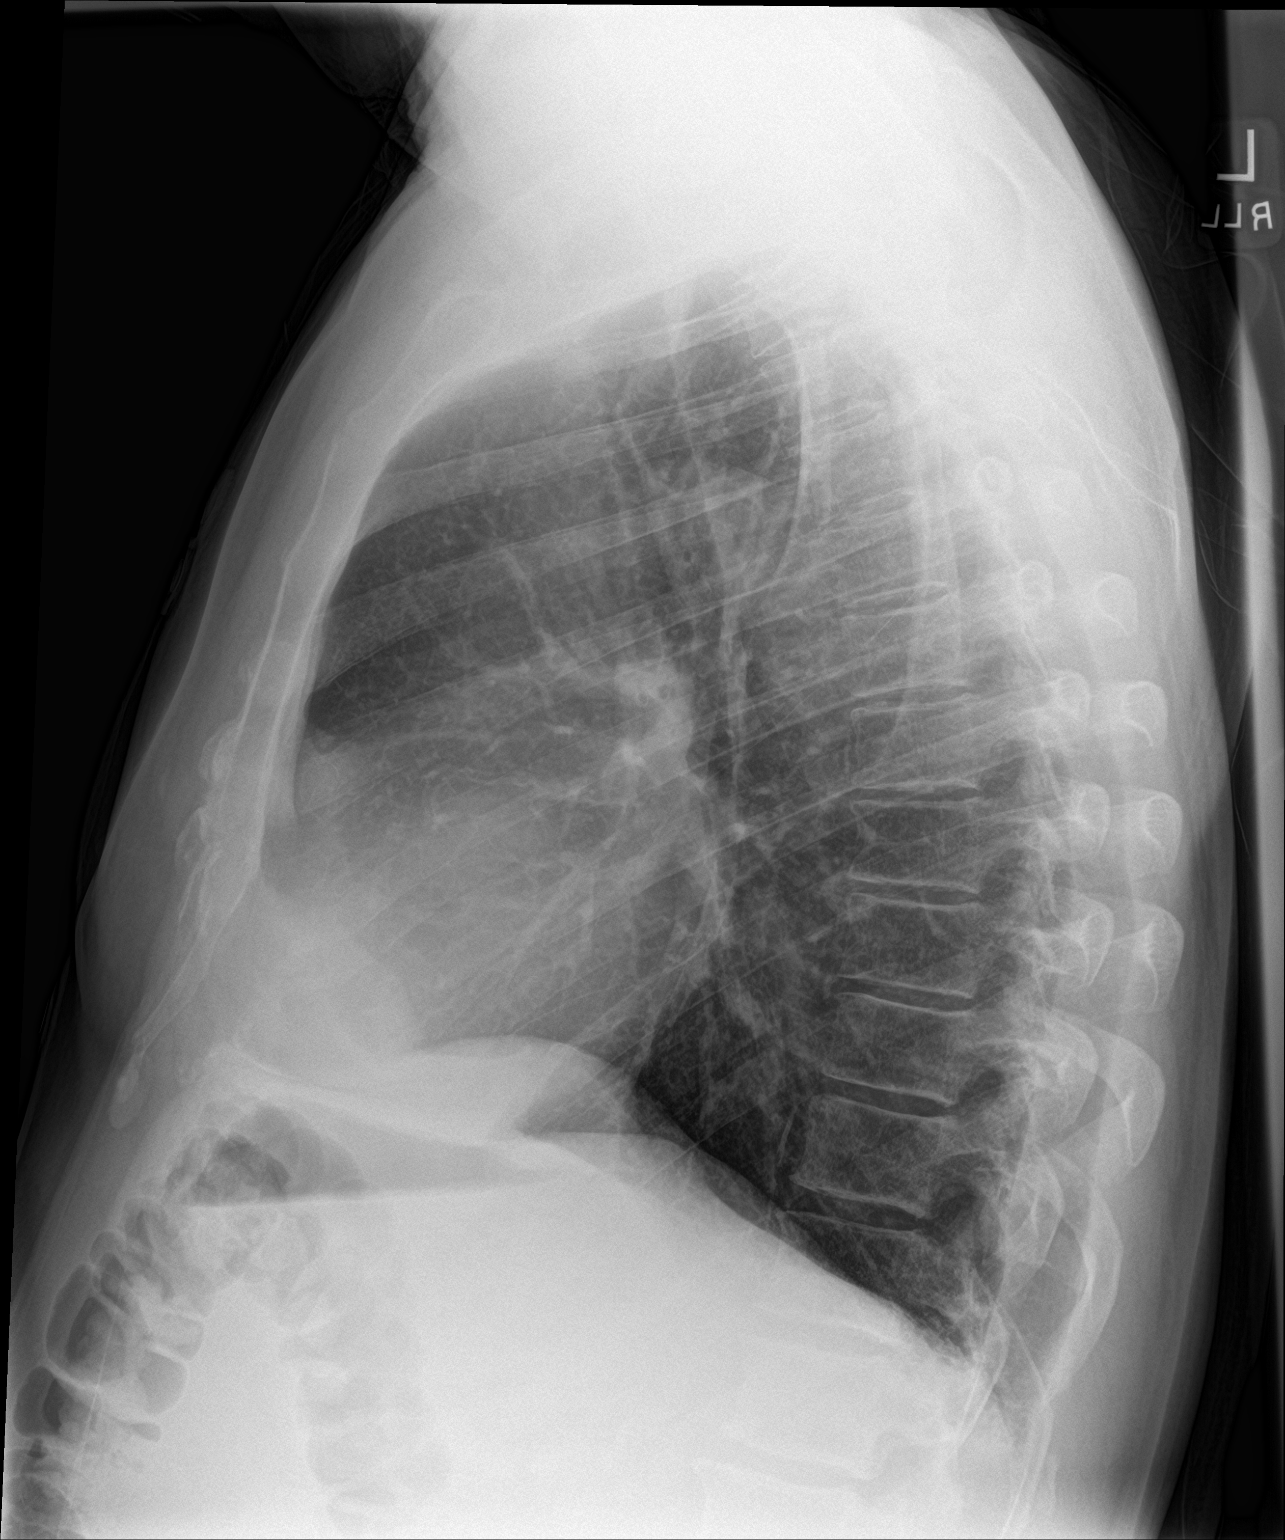

[2 of 2 positions shown; findings below may reference images not displayed]

FINDINGS: The heart size and mediastinal contours are within normal limits.
Both lungs are clear. The visualized skeletal structures are
unremarkable.
IMPRESSION: No active cardiopulmonary disease.
